# Patient Record
Sex: Male | Born: 1940 | Race: White | Hispanic: No | Marital: Married | Smoking: Never smoker
Health system: Southern US, Community
[De-identification: ages and names within clinical notes are randomized; demographics above are authoritative.]

## PROBLEM LIST (undated history)

## (undated) DIAGNOSIS — Z800 Family history of malignant neoplasm of digestive organs: Secondary | ICD-10-CM

## (undated) DIAGNOSIS — K2270 Barrett's esophagus without dysplasia: Secondary | ICD-10-CM

## (undated) DIAGNOSIS — M1990 Unspecified osteoarthritis, unspecified site: Secondary | ICD-10-CM

## (undated) DIAGNOSIS — Z801 Family history of malignant neoplasm of trachea, bronchus and lung: Secondary | ICD-10-CM

## (undated) DIAGNOSIS — C801 Malignant (primary) neoplasm, unspecified: Secondary | ICD-10-CM

## (undated) DIAGNOSIS — Z8719 Personal history of other diseases of the digestive system: Secondary | ICD-10-CM

## (undated) DIAGNOSIS — K5792 Diverticulitis of intestine, part unspecified, without perforation or abscess without bleeding: Secondary | ICD-10-CM

## (undated) DIAGNOSIS — Z808 Family history of malignant neoplasm of other organs or systems: Secondary | ICD-10-CM

## (undated) DIAGNOSIS — Z8042 Family history of malignant neoplasm of prostate: Secondary | ICD-10-CM

## (undated) DIAGNOSIS — R252 Cramp and spasm: Secondary | ICD-10-CM

## (undated) DIAGNOSIS — Z8041 Family history of malignant neoplasm of ovary: Secondary | ICD-10-CM

## (undated) DIAGNOSIS — R944 Abnormal results of kidney function studies: Secondary | ICD-10-CM

## (undated) DIAGNOSIS — Z8049 Family history of malignant neoplasm of other genital organs: Secondary | ICD-10-CM

## (undated) DIAGNOSIS — J189 Pneumonia, unspecified organism: Secondary | ICD-10-CM

## (undated) DIAGNOSIS — G7000 Myasthenia gravis without (acute) exacerbation: Secondary | ICD-10-CM

## (undated) DIAGNOSIS — D649 Anemia, unspecified: Secondary | ICD-10-CM

## (undated) HISTORY — DX: Family history of malignant neoplasm of prostate: Z80.42

## (undated) HISTORY — DX: Family history of malignant neoplasm of digestive organs: Z80.0

## (undated) HISTORY — PX: COLONOSCOPY: SHX174

## (undated) HISTORY — PX: CATARACT EXTRACTION, BILATERAL: SHX1313

## (undated) HISTORY — PX: VASECTOMY: SHX75

## (undated) HISTORY — PX: COLON SURGERY: SHX602

## (undated) HISTORY — DX: Family history of malignant neoplasm of other genital organs: Z80.49

## (undated) HISTORY — DX: Family history of malignant neoplasm of other organs or systems: Z80.8

## (undated) HISTORY — DX: Family history of malignant neoplasm of ovary: Z80.41

## (undated) HISTORY — DX: Family history of malignant neoplasm of trachea, bronchus and lung: Z80.1

---

## 2011-08-15 DIAGNOSIS — L570 Actinic keratosis: Secondary | ICD-10-CM | POA: Diagnosis not present

## 2011-08-15 DIAGNOSIS — L578 Other skin changes due to chronic exposure to nonionizing radiation: Secondary | ICD-10-CM | POA: Diagnosis not present

## 2011-08-15 DIAGNOSIS — B351 Tinea unguium: Secondary | ICD-10-CM | POA: Diagnosis not present

## 2011-08-15 DIAGNOSIS — D239 Other benign neoplasm of skin, unspecified: Secondary | ICD-10-CM | POA: Diagnosis not present

## 2011-09-13 DIAGNOSIS — G7000 Myasthenia gravis without (acute) exacerbation: Secondary | ICD-10-CM | POA: Diagnosis not present

## 2011-09-26 DIAGNOSIS — H532 Diplopia: Secondary | ICD-10-CM | POA: Diagnosis not present

## 2011-09-26 DIAGNOSIS — H2589 Other age-related cataract: Secondary | ICD-10-CM | POA: Diagnosis not present

## 2011-09-28 DIAGNOSIS — G7000 Myasthenia gravis without (acute) exacerbation: Secondary | ICD-10-CM | POA: Diagnosis not present

## 2011-09-28 DIAGNOSIS — R7309 Other abnormal glucose: Secondary | ICD-10-CM | POA: Diagnosis not present

## 2011-09-28 DIAGNOSIS — IMO0001 Reserved for inherently not codable concepts without codable children: Secondary | ICD-10-CM | POA: Diagnosis not present

## 2011-09-28 DIAGNOSIS — R609 Edema, unspecified: Secondary | ICD-10-CM | POA: Diagnosis not present

## 2011-10-10 DIAGNOSIS — G7000 Myasthenia gravis without (acute) exacerbation: Secondary | ICD-10-CM | POA: Diagnosis not present

## 2011-11-20 DIAGNOSIS — R609 Edema, unspecified: Secondary | ICD-10-CM | POA: Diagnosis not present

## 2011-11-20 DIAGNOSIS — R7309 Other abnormal glucose: Secondary | ICD-10-CM | POA: Diagnosis not present

## 2011-11-20 DIAGNOSIS — G7000 Myasthenia gravis without (acute) exacerbation: Secondary | ICD-10-CM | POA: Diagnosis not present

## 2011-12-11 DIAGNOSIS — G7000 Myasthenia gravis without (acute) exacerbation: Secondary | ICD-10-CM | POA: Diagnosis not present

## 2012-02-28 HISTORY — PX: COLOSTOMY: SHX63

## 2012-03-19 DIAGNOSIS — M47817 Spondylosis without myelopathy or radiculopathy, lumbosacral region: Secondary | ICD-10-CM | POA: Diagnosis not present

## 2012-03-21 DIAGNOSIS — R0989 Other specified symptoms and signs involving the circulatory and respiratory systems: Secondary | ICD-10-CM | POA: Diagnosis not present

## 2012-03-21 DIAGNOSIS — Z13820 Encounter for screening for osteoporosis: Secondary | ICD-10-CM | POA: Diagnosis not present

## 2012-03-21 DIAGNOSIS — N320 Bladder-neck obstruction: Secondary | ICD-10-CM | POA: Diagnosis not present

## 2012-03-21 DIAGNOSIS — M545 Low back pain: Secondary | ICD-10-CM | POA: Diagnosis not present

## 2012-03-21 DIAGNOSIS — E7800 Pure hypercholesterolemia, unspecified: Secondary | ICD-10-CM | POA: Diagnosis not present

## 2012-03-21 DIAGNOSIS — M899 Disorder of bone, unspecified: Secondary | ICD-10-CM | POA: Diagnosis not present

## 2012-03-21 DIAGNOSIS — R7309 Other abnormal glucose: Secondary | ICD-10-CM | POA: Diagnosis not present

## 2012-03-21 DIAGNOSIS — Z0000 Encounter for general adult medical examination without abnormal findings: Secondary | ICD-10-CM | POA: Diagnosis not present

## 2012-03-21 DIAGNOSIS — R0609 Other forms of dyspnea: Secondary | ICD-10-CM | POA: Diagnosis not present

## 2012-03-28 DIAGNOSIS — K6389 Other specified diseases of intestine: Secondary | ICD-10-CM | POA: Diagnosis not present

## 2012-03-28 DIAGNOSIS — R69 Illness, unspecified: Secondary | ICD-10-CM | POA: Diagnosis not present

## 2012-03-28 DIAGNOSIS — D126 Benign neoplasm of colon, unspecified: Secondary | ICD-10-CM | POA: Diagnosis not present

## 2012-03-28 DIAGNOSIS — R109 Unspecified abdominal pain: Secondary | ICD-10-CM | POA: Diagnosis not present

## 2012-03-28 DIAGNOSIS — K5732 Diverticulitis of large intestine without perforation or abscess without bleeding: Secondary | ICD-10-CM | POA: Diagnosis not present

## 2012-03-28 DIAGNOSIS — M4800 Spinal stenosis, site unspecified: Secondary | ICD-10-CM | POA: Diagnosis not present

## 2012-03-28 DIAGNOSIS — M545 Low back pain: Secondary | ICD-10-CM | POA: Diagnosis not present

## 2012-03-28 DIAGNOSIS — G7000 Myasthenia gravis without (acute) exacerbation: Secondary | ICD-10-CM | POA: Diagnosis not present

## 2012-03-28 DIAGNOSIS — Z79899 Other long term (current) drug therapy: Secondary | ICD-10-CM | POA: Diagnosis not present

## 2012-03-28 DIAGNOSIS — K2270 Barrett's esophagus without dysplasia: Secondary | ICD-10-CM | POA: Diagnosis not present

## 2012-03-28 DIAGNOSIS — K658 Other peritonitis: Secondary | ICD-10-CM | POA: Diagnosis not present

## 2012-03-28 DIAGNOSIS — IMO0002 Reserved for concepts with insufficient information to code with codable children: Secondary | ICD-10-CM | POA: Diagnosis not present

## 2012-03-28 DIAGNOSIS — K631 Perforation of intestine (nontraumatic): Secondary | ICD-10-CM | POA: Diagnosis not present

## 2012-03-28 DIAGNOSIS — K449 Diaphragmatic hernia without obstruction or gangrene: Secondary | ICD-10-CM | POA: Diagnosis not present

## 2012-03-28 DIAGNOSIS — K659 Peritonitis, unspecified: Secondary | ICD-10-CM | POA: Diagnosis not present

## 2012-03-28 DIAGNOSIS — K5730 Diverticulosis of large intestine without perforation or abscess without bleeding: Secondary | ICD-10-CM | POA: Diagnosis not present

## 2012-04-03 DIAGNOSIS — K650 Generalized (acute) peritonitis: Secondary | ICD-10-CM | POA: Diagnosis not present

## 2012-04-03 DIAGNOSIS — G8929 Other chronic pain: Secondary | ICD-10-CM | POA: Diagnosis not present

## 2012-04-03 DIAGNOSIS — G7000 Myasthenia gravis without (acute) exacerbation: Secondary | ICD-10-CM | POA: Diagnosis not present

## 2012-04-03 DIAGNOSIS — Z433 Encounter for attention to colostomy: Secondary | ICD-10-CM | POA: Diagnosis not present

## 2012-04-03 DIAGNOSIS — M4800 Spinal stenosis, site unspecified: Secondary | ICD-10-CM | POA: Diagnosis not present

## 2012-04-07 DIAGNOSIS — K650 Generalized (acute) peritonitis: Secondary | ICD-10-CM | POA: Diagnosis not present

## 2012-04-07 DIAGNOSIS — G8929 Other chronic pain: Secondary | ICD-10-CM | POA: Diagnosis not present

## 2012-04-07 DIAGNOSIS — M4800 Spinal stenosis, site unspecified: Secondary | ICD-10-CM | POA: Diagnosis not present

## 2012-04-07 DIAGNOSIS — Z433 Encounter for attention to colostomy: Secondary | ICD-10-CM | POA: Diagnosis not present

## 2012-04-07 DIAGNOSIS — G7000 Myasthenia gravis without (acute) exacerbation: Secondary | ICD-10-CM | POA: Diagnosis not present

## 2012-04-14 DIAGNOSIS — G8929 Other chronic pain: Secondary | ICD-10-CM | POA: Diagnosis not present

## 2012-04-14 DIAGNOSIS — M4800 Spinal stenosis, site unspecified: Secondary | ICD-10-CM | POA: Diagnosis not present

## 2012-04-14 DIAGNOSIS — Z433 Encounter for attention to colostomy: Secondary | ICD-10-CM | POA: Diagnosis not present

## 2012-04-14 DIAGNOSIS — G7000 Myasthenia gravis without (acute) exacerbation: Secondary | ICD-10-CM | POA: Diagnosis not present

## 2012-04-14 DIAGNOSIS — K650 Generalized (acute) peritonitis: Secondary | ICD-10-CM | POA: Diagnosis not present

## 2012-04-16 DIAGNOSIS — G7000 Myasthenia gravis without (acute) exacerbation: Secondary | ICD-10-CM | POA: Diagnosis not present

## 2012-04-18 DIAGNOSIS — M4800 Spinal stenosis, site unspecified: Secondary | ICD-10-CM | POA: Diagnosis not present

## 2012-04-18 DIAGNOSIS — Z433 Encounter for attention to colostomy: Secondary | ICD-10-CM | POA: Diagnosis not present

## 2012-04-18 DIAGNOSIS — G7000 Myasthenia gravis without (acute) exacerbation: Secondary | ICD-10-CM | POA: Diagnosis not present

## 2012-04-18 DIAGNOSIS — G8929 Other chronic pain: Secondary | ICD-10-CM | POA: Diagnosis not present

## 2012-04-18 DIAGNOSIS — K650 Generalized (acute) peritonitis: Secondary | ICD-10-CM | POA: Diagnosis not present

## 2012-04-21 DIAGNOSIS — M4800 Spinal stenosis, site unspecified: Secondary | ICD-10-CM | POA: Diagnosis not present

## 2012-04-21 DIAGNOSIS — G8929 Other chronic pain: Secondary | ICD-10-CM | POA: Diagnosis not present

## 2012-04-21 DIAGNOSIS — Z433 Encounter for attention to colostomy: Secondary | ICD-10-CM | POA: Diagnosis not present

## 2012-04-21 DIAGNOSIS — K650 Generalized (acute) peritonitis: Secondary | ICD-10-CM | POA: Diagnosis not present

## 2012-04-21 DIAGNOSIS — G7000 Myasthenia gravis without (acute) exacerbation: Secondary | ICD-10-CM | POA: Diagnosis not present

## 2012-05-15 DIAGNOSIS — M5126 Other intervertebral disc displacement, lumbar region: Secondary | ICD-10-CM | POA: Diagnosis not present

## 2012-05-15 DIAGNOSIS — M5137 Other intervertebral disc degeneration, lumbosacral region: Secondary | ICD-10-CM | POA: Diagnosis not present

## 2012-05-15 DIAGNOSIS — M5430 Sciatica, unspecified side: Secondary | ICD-10-CM | POA: Diagnosis not present

## 2012-05-15 DIAGNOSIS — M47817 Spondylosis without myelopathy or radiculopathy, lumbosacral region: Secondary | ICD-10-CM | POA: Diagnosis not present

## 2012-05-15 DIAGNOSIS — IMO0002 Reserved for concepts with insufficient information to code with codable children: Secondary | ICD-10-CM | POA: Diagnosis not present

## 2012-05-15 DIAGNOSIS — M48061 Spinal stenosis, lumbar region without neurogenic claudication: Secondary | ICD-10-CM | POA: Diagnosis not present

## 2012-05-27 DIAGNOSIS — IMO0002 Reserved for concepts with insufficient information to code with codable children: Secondary | ICD-10-CM | POA: Diagnosis not present

## 2012-05-30 HISTORY — PX: COLOSTOMY REVERSAL: SHX5782

## 2012-06-06 DIAGNOSIS — K6289 Other specified diseases of anus and rectum: Secondary | ICD-10-CM | POA: Diagnosis not present

## 2012-06-06 DIAGNOSIS — K5732 Diverticulitis of large intestine without perforation or abscess without bleeding: Secondary | ICD-10-CM | POA: Diagnosis not present

## 2012-06-06 DIAGNOSIS — Z433 Encounter for attention to colostomy: Secondary | ICD-10-CM | POA: Diagnosis not present

## 2012-06-06 DIAGNOSIS — G7000 Myasthenia gravis without (acute) exacerbation: Secondary | ICD-10-CM | POA: Diagnosis not present

## 2012-06-06 DIAGNOSIS — Z9889 Other specified postprocedural states: Secondary | ICD-10-CM | POA: Diagnosis not present

## 2012-06-06 DIAGNOSIS — Z880 Allergy status to penicillin: Secondary | ICD-10-CM | POA: Diagnosis not present

## 2012-06-06 DIAGNOSIS — M5430 Sciatica, unspecified side: Secondary | ICD-10-CM | POA: Diagnosis not present

## 2012-06-06 DIAGNOSIS — Z7982 Long term (current) use of aspirin: Secondary | ICD-10-CM | POA: Diagnosis not present

## 2012-06-11 DIAGNOSIS — R609 Edema, unspecified: Secondary | ICD-10-CM | POA: Diagnosis not present

## 2012-06-11 DIAGNOSIS — L0390 Cellulitis, unspecified: Secondary | ICD-10-CM | POA: Diagnosis not present

## 2012-06-11 DIAGNOSIS — L0291 Cutaneous abscess, unspecified: Secondary | ICD-10-CM | POA: Diagnosis not present

## 2012-06-19 DIAGNOSIS — R197 Diarrhea, unspecified: Secondary | ICD-10-CM | POA: Diagnosis not present

## 2012-07-08 DIAGNOSIS — K5732 Diverticulitis of large intestine without perforation or abscess without bleeding: Secondary | ICD-10-CM | POA: Diagnosis not present

## 2012-07-08 DIAGNOSIS — K2270 Barrett's esophagus without dysplasia: Secondary | ICD-10-CM | POA: Diagnosis not present

## 2012-07-08 DIAGNOSIS — R197 Diarrhea, unspecified: Secondary | ICD-10-CM | POA: Diagnosis not present

## 2012-07-17 DIAGNOSIS — G7000 Myasthenia gravis without (acute) exacerbation: Secondary | ICD-10-CM | POA: Diagnosis not present

## 2012-08-04 DIAGNOSIS — IMO0002 Reserved for concepts with insufficient information to code with codable children: Secondary | ICD-10-CM | POA: Diagnosis not present

## 2012-08-08 DIAGNOSIS — IMO0002 Reserved for concepts with insufficient information to code with codable children: Secondary | ICD-10-CM | POA: Diagnosis not present

## 2012-08-11 DIAGNOSIS — IMO0002 Reserved for concepts with insufficient information to code with codable children: Secondary | ICD-10-CM | POA: Diagnosis not present

## 2012-08-13 DIAGNOSIS — IMO0002 Reserved for concepts with insufficient information to code with codable children: Secondary | ICD-10-CM | POA: Diagnosis not present

## 2012-08-15 DIAGNOSIS — Z23 Encounter for immunization: Secondary | ICD-10-CM | POA: Diagnosis not present

## 2012-08-18 DIAGNOSIS — IMO0002 Reserved for concepts with insufficient information to code with codable children: Secondary | ICD-10-CM | POA: Diagnosis not present

## 2012-08-20 DIAGNOSIS — L578 Other skin changes due to chronic exposure to nonionizing radiation: Secondary | ICD-10-CM | POA: Diagnosis not present

## 2012-08-20 DIAGNOSIS — D485 Neoplasm of uncertain behavior of skin: Secondary | ICD-10-CM | POA: Diagnosis not present

## 2012-08-20 DIAGNOSIS — IMO0002 Reserved for concepts with insufficient information to code with codable children: Secondary | ICD-10-CM | POA: Diagnosis not present

## 2012-08-20 DIAGNOSIS — B351 Tinea unguium: Secondary | ICD-10-CM | POA: Diagnosis not present

## 2012-08-20 DIAGNOSIS — D239 Other benign neoplasm of skin, unspecified: Secondary | ICD-10-CM | POA: Diagnosis not present

## 2012-09-15 DIAGNOSIS — D239 Other benign neoplasm of skin, unspecified: Secondary | ICD-10-CM | POA: Diagnosis not present

## 2012-09-15 DIAGNOSIS — D485 Neoplasm of uncertain behavior of skin: Secondary | ICD-10-CM | POA: Diagnosis not present

## 2012-10-01 DIAGNOSIS — R1013 Epigastric pain: Secondary | ICD-10-CM | POA: Diagnosis not present

## 2012-10-01 DIAGNOSIS — E7800 Pure hypercholesterolemia, unspecified: Secondary | ICD-10-CM | POA: Diagnosis not present

## 2012-10-16 DIAGNOSIS — G7000 Myasthenia gravis without (acute) exacerbation: Secondary | ICD-10-CM | POA: Diagnosis not present

## 2012-10-17 DIAGNOSIS — IMO0002 Reserved for concepts with insufficient information to code with codable children: Secondary | ICD-10-CM | POA: Diagnosis not present

## 2012-10-22 DIAGNOSIS — IMO0002 Reserved for concepts with insufficient information to code with codable children: Secondary | ICD-10-CM | POA: Diagnosis not present

## 2012-10-29 DIAGNOSIS — IMO0002 Reserved for concepts with insufficient information to code with codable children: Secondary | ICD-10-CM | POA: Diagnosis not present

## 2013-01-12 DIAGNOSIS — L821 Other seborrheic keratosis: Secondary | ICD-10-CM | POA: Diagnosis not present

## 2013-01-12 DIAGNOSIS — L578 Other skin changes due to chronic exposure to nonionizing radiation: Secondary | ICD-10-CM | POA: Diagnosis not present

## 2013-01-12 DIAGNOSIS — B079 Viral wart, unspecified: Secondary | ICD-10-CM | POA: Diagnosis not present

## 2013-01-12 DIAGNOSIS — D239 Other benign neoplasm of skin, unspecified: Secondary | ICD-10-CM | POA: Diagnosis not present

## 2013-01-21 DIAGNOSIS — G7000 Myasthenia gravis without (acute) exacerbation: Secondary | ICD-10-CM | POA: Diagnosis not present

## 2013-04-14 DIAGNOSIS — R5383 Other fatigue: Secondary | ICD-10-CM | POA: Diagnosis not present

## 2013-04-14 DIAGNOSIS — E119 Type 2 diabetes mellitus without complications: Secondary | ICD-10-CM | POA: Diagnosis not present

## 2013-04-14 DIAGNOSIS — R5381 Other malaise: Secondary | ICD-10-CM | POA: Diagnosis not present

## 2013-04-14 DIAGNOSIS — E7800 Pure hypercholesterolemia, unspecified: Secondary | ICD-10-CM | POA: Diagnosis not present

## 2013-04-14 DIAGNOSIS — Z0000 Encounter for general adult medical examination without abnormal findings: Secondary | ICD-10-CM | POA: Diagnosis not present

## 2013-04-14 DIAGNOSIS — Z23 Encounter for immunization: Secondary | ICD-10-CM | POA: Diagnosis not present

## 2013-04-14 DIAGNOSIS — N320 Bladder-neck obstruction: Secondary | ICD-10-CM | POA: Diagnosis not present

## 2013-05-27 DIAGNOSIS — G7000 Myasthenia gravis without (acute) exacerbation: Secondary | ICD-10-CM | POA: Diagnosis not present

## 2013-05-28 DIAGNOSIS — R799 Abnormal finding of blood chemistry, unspecified: Secondary | ICD-10-CM | POA: Diagnosis not present

## 2013-05-28 DIAGNOSIS — R972 Elevated prostate specific antigen [PSA]: Secondary | ICD-10-CM | POA: Diagnosis not present

## 2013-06-09 DIAGNOSIS — R21 Rash and other nonspecific skin eruption: Secondary | ICD-10-CM | POA: Diagnosis not present

## 2013-06-09 DIAGNOSIS — N189 Chronic kidney disease, unspecified: Secondary | ICD-10-CM | POA: Diagnosis not present

## 2013-06-09 DIAGNOSIS — R972 Elevated prostate specific antigen [PSA]: Secondary | ICD-10-CM | POA: Diagnosis not present

## 2013-06-16 DIAGNOSIS — R799 Abnormal finding of blood chemistry, unspecified: Secondary | ICD-10-CM | POA: Diagnosis not present

## 2013-06-16 DIAGNOSIS — H2589 Other age-related cataract: Secondary | ICD-10-CM | POA: Diagnosis not present

## 2013-06-16 DIAGNOSIS — N281 Cyst of kidney, acquired: Secondary | ICD-10-CM | POA: Diagnosis not present

## 2013-06-16 DIAGNOSIS — R944 Abnormal results of kidney function studies: Secondary | ICD-10-CM | POA: Diagnosis not present

## 2013-06-26 DIAGNOSIS — D801 Nonfamilial hypogammaglobulinemia: Secondary | ICD-10-CM | POA: Diagnosis not present

## 2013-06-26 DIAGNOSIS — D649 Anemia, unspecified: Secondary | ICD-10-CM | POA: Diagnosis not present

## 2013-06-26 DIAGNOSIS — R799 Abnormal finding of blood chemistry, unspecified: Secondary | ICD-10-CM | POA: Diagnosis not present

## 2013-06-26 DIAGNOSIS — D472 Monoclonal gammopathy: Secondary | ICD-10-CM | POA: Diagnosis not present

## 2013-06-29 DIAGNOSIS — Z85828 Personal history of other malignant neoplasm of skin: Secondary | ICD-10-CM | POA: Diagnosis not present

## 2013-06-29 DIAGNOSIS — L738 Other specified follicular disorders: Secondary | ICD-10-CM | POA: Diagnosis not present

## 2013-06-29 DIAGNOSIS — L565 Disseminated superficial actinic porokeratosis (DSAP): Secondary | ICD-10-CM | POA: Diagnosis not present

## 2013-06-29 DIAGNOSIS — L820 Inflamed seborrheic keratosis: Secondary | ICD-10-CM | POA: Diagnosis not present

## 2013-06-29 DIAGNOSIS — L821 Other seborrheic keratosis: Secondary | ICD-10-CM | POA: Diagnosis not present

## 2013-06-29 DIAGNOSIS — D485 Neoplasm of uncertain behavior of skin: Secondary | ICD-10-CM | POA: Diagnosis not present

## 2013-06-29 DIAGNOSIS — D235 Other benign neoplasm of skin of trunk: Secondary | ICD-10-CM | POA: Diagnosis not present

## 2013-07-15 DIAGNOSIS — L821 Other seborrheic keratosis: Secondary | ICD-10-CM | POA: Diagnosis not present

## 2013-07-15 DIAGNOSIS — L570 Actinic keratosis: Secondary | ICD-10-CM | POA: Diagnosis not present

## 2013-07-28 DIAGNOSIS — D649 Anemia, unspecified: Secondary | ICD-10-CM | POA: Diagnosis not present

## 2013-07-28 DIAGNOSIS — D472 Monoclonal gammopathy: Secondary | ICD-10-CM | POA: Diagnosis not present

## 2013-09-30 DIAGNOSIS — G7000 Myasthenia gravis without (acute) exacerbation: Secondary | ICD-10-CM | POA: Diagnosis not present

## 2013-10-16 DIAGNOSIS — G7000 Myasthenia gravis without (acute) exacerbation: Secondary | ICD-10-CM | POA: Diagnosis not present

## 2013-10-16 DIAGNOSIS — E7800 Pure hypercholesterolemia, unspecified: Secondary | ICD-10-CM | POA: Diagnosis not present

## 2013-10-16 DIAGNOSIS — N189 Chronic kidney disease, unspecified: Secondary | ICD-10-CM | POA: Diagnosis not present

## 2013-10-16 DIAGNOSIS — D649 Anemia, unspecified: Secondary | ICD-10-CM | POA: Diagnosis not present

## 2013-10-16 DIAGNOSIS — R972 Elevated prostate specific antigen [PSA]: Secondary | ICD-10-CM | POA: Diagnosis not present

## 2013-10-16 DIAGNOSIS — N400 Enlarged prostate without lower urinary tract symptoms: Secondary | ICD-10-CM | POA: Diagnosis not present

## 2013-11-17 DIAGNOSIS — Z85828 Personal history of other malignant neoplasm of skin: Secondary | ICD-10-CM | POA: Diagnosis not present

## 2013-11-17 DIAGNOSIS — L570 Actinic keratosis: Secondary | ICD-10-CM | POA: Diagnosis not present

## 2013-11-17 DIAGNOSIS — L821 Other seborrheic keratosis: Secondary | ICD-10-CM | POA: Diagnosis not present

## 2013-11-17 DIAGNOSIS — B079 Viral wart, unspecified: Secondary | ICD-10-CM | POA: Diagnosis not present

## 2013-12-30 DIAGNOSIS — M25819 Other specified joint disorders, unspecified shoulder: Secondary | ICD-10-CM | POA: Diagnosis not present

## 2014-01-06 DIAGNOSIS — R609 Edema, unspecified: Secondary | ICD-10-CM | POA: Diagnosis not present

## 2014-01-06 DIAGNOSIS — Z8719 Personal history of other diseases of the digestive system: Secondary | ICD-10-CM | POA: Diagnosis not present

## 2014-01-06 DIAGNOSIS — IMO0002 Reserved for concepts with insufficient information to code with codable children: Secondary | ICD-10-CM | POA: Diagnosis not present

## 2014-01-06 DIAGNOSIS — K219 Gastro-esophageal reflux disease without esophagitis: Secondary | ICD-10-CM | POA: Diagnosis not present

## 2014-01-06 DIAGNOSIS — G7000 Myasthenia gravis without (acute) exacerbation: Secondary | ICD-10-CM | POA: Diagnosis not present

## 2014-01-19 DIAGNOSIS — D472 Monoclonal gammopathy: Secondary | ICD-10-CM | POA: Diagnosis not present

## 2014-01-19 DIAGNOSIS — D509 Iron deficiency anemia, unspecified: Secondary | ICD-10-CM | POA: Diagnosis not present

## 2014-01-27 DIAGNOSIS — M25819 Other specified joint disorders, unspecified shoulder: Secondary | ICD-10-CM | POA: Diagnosis not present

## 2014-02-04 DIAGNOSIS — D509 Iron deficiency anemia, unspecified: Secondary | ICD-10-CM | POA: Diagnosis not present

## 2014-02-08 DIAGNOSIS — M25819 Other specified joint disorders, unspecified shoulder: Secondary | ICD-10-CM | POA: Diagnosis not present

## 2014-02-10 DIAGNOSIS — Z85828 Personal history of other malignant neoplasm of skin: Secondary | ICD-10-CM | POA: Diagnosis not present

## 2014-02-10 DIAGNOSIS — L84 Corns and callosities: Secondary | ICD-10-CM | POA: Diagnosis not present

## 2014-02-10 DIAGNOSIS — L821 Other seborrheic keratosis: Secondary | ICD-10-CM | POA: Diagnosis not present

## 2014-02-10 DIAGNOSIS — L819 Disorder of pigmentation, unspecified: Secondary | ICD-10-CM | POA: Diagnosis not present

## 2014-02-10 DIAGNOSIS — B353 Tinea pedis: Secondary | ICD-10-CM | POA: Diagnosis not present

## 2014-02-10 DIAGNOSIS — D239 Other benign neoplasm of skin, unspecified: Secondary | ICD-10-CM | POA: Diagnosis not present

## 2014-02-15 DIAGNOSIS — M25819 Other specified joint disorders, unspecified shoulder: Secondary | ICD-10-CM | POA: Diagnosis not present

## 2014-02-18 DIAGNOSIS — G7000 Myasthenia gravis without (acute) exacerbation: Secondary | ICD-10-CM | POA: Diagnosis not present

## 2014-02-24 DIAGNOSIS — M25819 Other specified joint disorders, unspecified shoulder: Secondary | ICD-10-CM | POA: Diagnosis not present

## 2014-03-02 DIAGNOSIS — M899 Disorder of bone, unspecified: Secondary | ICD-10-CM | POA: Diagnosis not present

## 2014-03-03 DIAGNOSIS — M25819 Other specified joint disorders, unspecified shoulder: Secondary | ICD-10-CM | POA: Diagnosis not present

## 2014-03-09 DIAGNOSIS — M5090 Cervical disc disorder, unspecified, unspecified cervical region: Secondary | ICD-10-CM | POA: Diagnosis not present

## 2014-03-09 DIAGNOSIS — M542 Cervicalgia: Secondary | ICD-10-CM | POA: Diagnosis not present

## 2014-03-19 DIAGNOSIS — M542 Cervicalgia: Secondary | ICD-10-CM | POA: Diagnosis not present

## 2014-03-22 DIAGNOSIS — M25819 Other specified joint disorders, unspecified shoulder: Secondary | ICD-10-CM | POA: Diagnosis not present

## 2014-03-29 DIAGNOSIS — H04129 Dry eye syndrome of unspecified lacrimal gland: Secondary | ICD-10-CM | POA: Diagnosis not present

## 2014-03-29 DIAGNOSIS — H25019 Cortical age-related cataract, unspecified eye: Secondary | ICD-10-CM | POA: Diagnosis not present

## 2014-03-31 DIAGNOSIS — Z0000 Encounter for general adult medical examination without abnormal findings: Secondary | ICD-10-CM | POA: Diagnosis not present

## 2014-03-31 DIAGNOSIS — Z1331 Encounter for screening for depression: Secondary | ICD-10-CM | POA: Diagnosis not present

## 2014-03-31 DIAGNOSIS — N183 Chronic kidney disease, stage 3 unspecified: Secondary | ICD-10-CM | POA: Diagnosis not present

## 2014-03-31 DIAGNOSIS — M542 Cervicalgia: Secondary | ICD-10-CM | POA: Diagnosis not present

## 2014-03-31 DIAGNOSIS — D472 Monoclonal gammopathy: Secondary | ICD-10-CM | POA: Diagnosis not present

## 2014-03-31 DIAGNOSIS — R5381 Other malaise: Secondary | ICD-10-CM | POA: Diagnosis not present

## 2014-03-31 DIAGNOSIS — K219 Gastro-esophageal reflux disease without esophagitis: Secondary | ICD-10-CM | POA: Diagnosis not present

## 2014-03-31 DIAGNOSIS — Z8719 Personal history of other diseases of the digestive system: Secondary | ICD-10-CM | POA: Diagnosis not present

## 2014-03-31 DIAGNOSIS — D509 Iron deficiency anemia, unspecified: Secondary | ICD-10-CM | POA: Diagnosis not present

## 2014-03-31 DIAGNOSIS — Z1211 Encounter for screening for malignant neoplasm of colon: Secondary | ICD-10-CM | POA: Diagnosis not present

## 2014-03-31 DIAGNOSIS — G7000 Myasthenia gravis without (acute) exacerbation: Secondary | ICD-10-CM | POA: Diagnosis not present

## 2014-03-31 DIAGNOSIS — Z136 Encounter for screening for cardiovascular disorders: Secondary | ICD-10-CM | POA: Diagnosis not present

## 2014-03-31 DIAGNOSIS — N400 Enlarged prostate without lower urinary tract symptoms: Secondary | ICD-10-CM | POA: Diagnosis not present

## 2014-04-06 DIAGNOSIS — M5090 Cervical disc disorder, unspecified, unspecified cervical region: Secondary | ICD-10-CM | POA: Diagnosis not present

## 2014-04-28 DIAGNOSIS — D509 Iron deficiency anemia, unspecified: Secondary | ICD-10-CM | POA: Diagnosis not present

## 2014-04-28 DIAGNOSIS — K2270 Barrett's esophagus without dysplasia: Secondary | ICD-10-CM | POA: Diagnosis not present

## 2014-06-01 DIAGNOSIS — G7000 Myasthenia gravis without (acute) exacerbation: Secondary | ICD-10-CM | POA: Diagnosis not present

## 2014-06-08 DIAGNOSIS — K2270 Barrett's esophagus without dysplasia: Secondary | ICD-10-CM | POA: Diagnosis not present

## 2014-06-08 DIAGNOSIS — K208 Other esophagitis: Secondary | ICD-10-CM | POA: Diagnosis not present

## 2014-06-08 DIAGNOSIS — K449 Diaphragmatic hernia without obstruction or gangrene: Secondary | ICD-10-CM | POA: Diagnosis not present

## 2014-06-08 DIAGNOSIS — K529 Noninfective gastroenteritis and colitis, unspecified: Secondary | ICD-10-CM | POA: Diagnosis not present

## 2014-06-08 DIAGNOSIS — D509 Iron deficiency anemia, unspecified: Secondary | ICD-10-CM | POA: Diagnosis not present

## 2014-06-08 DIAGNOSIS — K6389 Other specified diseases of intestine: Secondary | ICD-10-CM | POA: Diagnosis not present

## 2014-06-08 DIAGNOSIS — K633 Ulcer of intestine: Secondary | ICD-10-CM | POA: Diagnosis not present

## 2014-06-08 DIAGNOSIS — K5730 Diverticulosis of large intestine without perforation or abscess without bleeding: Secondary | ICD-10-CM | POA: Diagnosis not present

## 2014-06-08 DIAGNOSIS — Z86010 Personal history of colonic polyps: Secondary | ICD-10-CM | POA: Diagnosis not present

## 2014-06-14 DIAGNOSIS — Z23 Encounter for immunization: Secondary | ICD-10-CM | POA: Diagnosis not present

## 2014-06-17 DIAGNOSIS — Z23 Encounter for immunization: Secondary | ICD-10-CM | POA: Diagnosis not present

## 2014-07-14 ENCOUNTER — Other Ambulatory Visit: Payer: Self-pay | Admitting: Internal Medicine

## 2014-07-14 ENCOUNTER — Ambulatory Visit
Admission: RE | Admit: 2014-07-14 | Discharge: 2014-07-14 | Disposition: A | Discharge status: Home / Self Care | Payer: Medicare Other | Source: Ambulatory Visit | Attending: Internal Medicine | Admitting: Internal Medicine

## 2014-07-14 DIAGNOSIS — R5383 Other fatigue: Secondary | ICD-10-CM | POA: Diagnosis not present

## 2014-07-14 DIAGNOSIS — D239 Other benign neoplasm of skin, unspecified: Secondary | ICD-10-CM | POA: Diagnosis not present

## 2014-07-14 DIAGNOSIS — D509 Iron deficiency anemia, unspecified: Secondary | ICD-10-CM | POA: Diagnosis not present

## 2014-07-14 DIAGNOSIS — R059 Cough, unspecified: Secondary | ICD-10-CM

## 2014-07-14 DIAGNOSIS — R05 Cough: Secondary | ICD-10-CM | POA: Diagnosis not present

## 2014-07-14 DIAGNOSIS — L814 Other melanin hyperpigmentation: Secondary | ICD-10-CM | POA: Diagnosis not present

## 2014-07-14 DIAGNOSIS — B353 Tinea pedis: Secondary | ICD-10-CM | POA: Diagnosis not present

## 2014-07-14 DIAGNOSIS — Z85828 Personal history of other malignant neoplasm of skin: Secondary | ICD-10-CM | POA: Diagnosis not present

## 2014-07-14 DIAGNOSIS — J984 Other disorders of lung: Secondary | ICD-10-CM | POA: Diagnosis not present

## 2014-07-14 DIAGNOSIS — L821 Other seborrheic keratosis: Secondary | ICD-10-CM | POA: Diagnosis not present

## 2014-07-15 DIAGNOSIS — M7062 Trochanteric bursitis, left hip: Secondary | ICD-10-CM | POA: Diagnosis not present

## 2014-07-15 DIAGNOSIS — M7061 Trochanteric bursitis, right hip: Secondary | ICD-10-CM | POA: Diagnosis not present

## 2014-07-20 DIAGNOSIS — M25552 Pain in left hip: Secondary | ICD-10-CM | POA: Diagnosis not present

## 2014-07-20 DIAGNOSIS — M25551 Pain in right hip: Secondary | ICD-10-CM | POA: Diagnosis not present

## 2014-07-27 DIAGNOSIS — M25551 Pain in right hip: Secondary | ICD-10-CM | POA: Diagnosis not present

## 2014-07-27 DIAGNOSIS — M25552 Pain in left hip: Secondary | ICD-10-CM | POA: Diagnosis not present

## 2014-08-02 DIAGNOSIS — M25552 Pain in left hip: Secondary | ICD-10-CM | POA: Diagnosis not present

## 2014-08-02 DIAGNOSIS — M25551 Pain in right hip: Secondary | ICD-10-CM | POA: Diagnosis not present

## 2014-08-04 DIAGNOSIS — G7000 Myasthenia gravis without (acute) exacerbation: Secondary | ICD-10-CM | POA: Diagnosis not present

## 2014-08-05 DIAGNOSIS — M25552 Pain in left hip: Secondary | ICD-10-CM | POA: Diagnosis not present

## 2014-08-05 DIAGNOSIS — M25551 Pain in right hip: Secondary | ICD-10-CM | POA: Diagnosis not present

## 2014-08-09 DIAGNOSIS — M25551 Pain in right hip: Secondary | ICD-10-CM | POA: Diagnosis not present

## 2014-08-09 DIAGNOSIS — M25552 Pain in left hip: Secondary | ICD-10-CM | POA: Diagnosis not present

## 2014-08-12 DIAGNOSIS — M25552 Pain in left hip: Secondary | ICD-10-CM | POA: Diagnosis not present

## 2014-08-12 DIAGNOSIS — M25551 Pain in right hip: Secondary | ICD-10-CM | POA: Diagnosis not present

## 2014-08-16 DIAGNOSIS — M25552 Pain in left hip: Secondary | ICD-10-CM | POA: Diagnosis not present

## 2014-08-16 DIAGNOSIS — M25551 Pain in right hip: Secondary | ICD-10-CM | POA: Diagnosis not present

## 2014-08-18 DIAGNOSIS — M25551 Pain in right hip: Secondary | ICD-10-CM | POA: Diagnosis not present

## 2014-08-18 DIAGNOSIS — M25552 Pain in left hip: Secondary | ICD-10-CM | POA: Diagnosis not present

## 2014-08-19 DIAGNOSIS — M25551 Pain in right hip: Secondary | ICD-10-CM | POA: Diagnosis not present

## 2014-08-23 ENCOUNTER — Other Ambulatory Visit: Payer: Self-pay | Admitting: Internal Medicine

## 2014-08-23 ENCOUNTER — Ambulatory Visit
Admission: RE | Admit: 2014-08-23 | Discharge: 2014-08-23 | Disposition: A | Discharge status: Home / Self Care | Payer: Medicare Other | Source: Ambulatory Visit | Attending: Internal Medicine | Admitting: Internal Medicine

## 2014-08-23 DIAGNOSIS — J9811 Atelectasis: Secondary | ICD-10-CM | POA: Diagnosis not present

## 2014-08-23 DIAGNOSIS — R05 Cough: Secondary | ICD-10-CM | POA: Diagnosis not present

## 2014-08-24 DIAGNOSIS — M25552 Pain in left hip: Secondary | ICD-10-CM | POA: Diagnosis not present

## 2014-08-24 DIAGNOSIS — M25551 Pain in right hip: Secondary | ICD-10-CM | POA: Diagnosis not present

## 2014-08-26 DIAGNOSIS — R05 Cough: Secondary | ICD-10-CM | POA: Diagnosis not present

## 2014-08-26 DIAGNOSIS — M25552 Pain in left hip: Secondary | ICD-10-CM | POA: Diagnosis not present

## 2014-08-26 DIAGNOSIS — M25551 Pain in right hip: Secondary | ICD-10-CM | POA: Diagnosis not present

## 2014-09-06 DIAGNOSIS — M67911 Unspecified disorder of synovium and tendon, right shoulder: Secondary | ICD-10-CM | POA: Diagnosis not present

## 2014-09-09 DIAGNOSIS — M25551 Pain in right hip: Secondary | ICD-10-CM | POA: Diagnosis not present

## 2014-09-09 DIAGNOSIS — D509 Iron deficiency anemia, unspecified: Secondary | ICD-10-CM | POA: Diagnosis not present

## 2014-09-09 DIAGNOSIS — M25552 Pain in left hip: Secondary | ICD-10-CM | POA: Diagnosis not present

## 2014-09-09 DIAGNOSIS — K2270 Barrett's esophagus without dysplasia: Secondary | ICD-10-CM | POA: Diagnosis not present

## 2014-09-09 DIAGNOSIS — K219 Gastro-esophageal reflux disease without esophagitis: Secondary | ICD-10-CM | POA: Diagnosis not present

## 2014-09-16 DIAGNOSIS — M25511 Pain in right shoulder: Secondary | ICD-10-CM | POA: Diagnosis not present

## 2014-09-16 DIAGNOSIS — M25552 Pain in left hip: Secondary | ICD-10-CM | POA: Diagnosis not present

## 2014-09-21 DIAGNOSIS — M25552 Pain in left hip: Secondary | ICD-10-CM | POA: Diagnosis not present

## 2014-09-21 DIAGNOSIS — M25551 Pain in right hip: Secondary | ICD-10-CM | POA: Diagnosis not present

## 2014-09-23 DIAGNOSIS — M25511 Pain in right shoulder: Secondary | ICD-10-CM | POA: Diagnosis not present

## 2014-09-23 DIAGNOSIS — M7581 Other shoulder lesions, right shoulder: Secondary | ICD-10-CM | POA: Diagnosis not present

## 2014-09-29 DIAGNOSIS — Z79899 Other long term (current) drug therapy: Secondary | ICD-10-CM | POA: Diagnosis not present

## 2014-09-29 DIAGNOSIS — G7000 Myasthenia gravis without (acute) exacerbation: Secondary | ICD-10-CM | POA: Diagnosis not present

## 2014-09-29 DIAGNOSIS — D472 Monoclonal gammopathy: Secondary | ICD-10-CM | POA: Diagnosis not present

## 2014-09-29 DIAGNOSIS — R05 Cough: Secondary | ICD-10-CM | POA: Diagnosis not present

## 2014-09-29 DIAGNOSIS — M7581 Other shoulder lesions, right shoulder: Secondary | ICD-10-CM | POA: Diagnosis not present

## 2014-09-29 DIAGNOSIS — M25511 Pain in right shoulder: Secondary | ICD-10-CM | POA: Diagnosis not present

## 2014-10-07 DIAGNOSIS — M7581 Other shoulder lesions, right shoulder: Secondary | ICD-10-CM | POA: Diagnosis not present

## 2014-10-07 DIAGNOSIS — M25511 Pain in right shoulder: Secondary | ICD-10-CM | POA: Diagnosis not present

## 2014-10-13 DIAGNOSIS — M25511 Pain in right shoulder: Secondary | ICD-10-CM | POA: Diagnosis not present

## 2014-10-25 DIAGNOSIS — M79662 Pain in left lower leg: Secondary | ICD-10-CM | POA: Diagnosis not present

## 2014-10-28 DIAGNOSIS — G7000 Myasthenia gravis without (acute) exacerbation: Secondary | ICD-10-CM | POA: Diagnosis not present

## 2014-11-01 DIAGNOSIS — H25813 Combined forms of age-related cataract, bilateral: Secondary | ICD-10-CM | POA: Diagnosis not present

## 2014-11-01 DIAGNOSIS — H524 Presbyopia: Secondary | ICD-10-CM | POA: Diagnosis not present

## 2014-11-01 DIAGNOSIS — H52222 Regular astigmatism, left eye: Secondary | ICD-10-CM | POA: Diagnosis not present

## 2014-11-01 DIAGNOSIS — H5211 Myopia, right eye: Secondary | ICD-10-CM | POA: Diagnosis not present

## 2014-11-04 DIAGNOSIS — M79662 Pain in left lower leg: Secondary | ICD-10-CM | POA: Diagnosis not present

## 2014-11-04 DIAGNOSIS — M7989 Other specified soft tissue disorders: Secondary | ICD-10-CM | POA: Diagnosis not present

## 2014-11-08 DIAGNOSIS — M7541 Impingement syndrome of right shoulder: Secondary | ICD-10-CM | POA: Diagnosis not present

## 2014-11-16 DIAGNOSIS — M7061 Trochanteric bursitis, right hip: Secondary | ICD-10-CM | POA: Diagnosis not present

## 2014-11-22 DIAGNOSIS — M79662 Pain in left lower leg: Secondary | ICD-10-CM | POA: Diagnosis not present

## 2014-12-14 DIAGNOSIS — M7061 Trochanteric bursitis, right hip: Secondary | ICD-10-CM | POA: Diagnosis not present

## 2014-12-14 DIAGNOSIS — M7062 Trochanteric bursitis, left hip: Secondary | ICD-10-CM | POA: Diagnosis not present

## 2015-01-05 DIAGNOSIS — D225 Melanocytic nevi of trunk: Secondary | ICD-10-CM | POA: Diagnosis not present

## 2015-01-05 DIAGNOSIS — L821 Other seborrheic keratosis: Secondary | ICD-10-CM | POA: Diagnosis not present

## 2015-01-05 DIAGNOSIS — Z85828 Personal history of other malignant neoplasm of skin: Secondary | ICD-10-CM | POA: Diagnosis not present

## 2015-01-05 DIAGNOSIS — B353 Tinea pedis: Secondary | ICD-10-CM | POA: Diagnosis not present

## 2015-01-20 DIAGNOSIS — G7000 Myasthenia gravis without (acute) exacerbation: Secondary | ICD-10-CM | POA: Diagnosis not present

## 2015-01-27 DIAGNOSIS — E611 Iron deficiency: Secondary | ICD-10-CM | POA: Diagnosis not present

## 2015-01-27 DIAGNOSIS — D472 Monoclonal gammopathy: Secondary | ICD-10-CM | POA: Diagnosis not present

## 2015-02-10 DIAGNOSIS — D509 Iron deficiency anemia, unspecified: Secondary | ICD-10-CM | POA: Diagnosis not present

## 2015-03-02 DIAGNOSIS — M7541 Impingement syndrome of right shoulder: Secondary | ICD-10-CM | POA: Diagnosis not present

## 2015-03-08 DIAGNOSIS — M7541 Impingement syndrome of right shoulder: Secondary | ICD-10-CM | POA: Diagnosis not present

## 2015-03-16 DIAGNOSIS — M7541 Impingement syndrome of right shoulder: Secondary | ICD-10-CM | POA: Diagnosis not present

## 2015-03-17 DIAGNOSIS — Z86010 Personal history of colonic polyps: Secondary | ICD-10-CM | POA: Diagnosis not present

## 2015-03-17 DIAGNOSIS — K2270 Barrett's esophagus without dysplasia: Secondary | ICD-10-CM | POA: Diagnosis not present

## 2015-03-28 DIAGNOSIS — M7541 Impingement syndrome of right shoulder: Secondary | ICD-10-CM | POA: Diagnosis not present

## 2015-03-31 DIAGNOSIS — M7061 Trochanteric bursitis, right hip: Secondary | ICD-10-CM | POA: Diagnosis not present

## 2015-04-08 DIAGNOSIS — D509 Iron deficiency anemia, unspecified: Secondary | ICD-10-CM | POA: Diagnosis not present

## 2015-04-08 DIAGNOSIS — G7000 Myasthenia gravis without (acute) exacerbation: Secondary | ICD-10-CM | POA: Diagnosis not present

## 2015-04-08 DIAGNOSIS — Z0000 Encounter for general adult medical examination without abnormal findings: Secondary | ICD-10-CM | POA: Diagnosis not present

## 2015-04-08 DIAGNOSIS — K2270 Barrett's esophagus without dysplasia: Secondary | ICD-10-CM | POA: Diagnosis not present

## 2015-04-08 DIAGNOSIS — Z79899 Other long term (current) drug therapy: Secondary | ICD-10-CM | POA: Diagnosis not present

## 2015-04-08 DIAGNOSIS — N183 Chronic kidney disease, stage 3 (moderate): Secondary | ICD-10-CM | POA: Diagnosis not present

## 2015-04-08 DIAGNOSIS — Z1389 Encounter for screening for other disorder: Secondary | ICD-10-CM | POA: Diagnosis not present

## 2015-04-08 DIAGNOSIS — D472 Monoclonal gammopathy: Secondary | ICD-10-CM | POA: Diagnosis not present

## 2015-04-08 DIAGNOSIS — Z125 Encounter for screening for malignant neoplasm of prostate: Secondary | ICD-10-CM | POA: Diagnosis not present

## 2015-05-19 DIAGNOSIS — M5441 Lumbago with sciatica, right side: Secondary | ICD-10-CM | POA: Diagnosis not present

## 2015-05-26 DIAGNOSIS — M545 Low back pain: Secondary | ICD-10-CM | POA: Diagnosis not present

## 2015-06-01 DIAGNOSIS — M4806 Spinal stenosis, lumbar region: Secondary | ICD-10-CM | POA: Diagnosis not present

## 2015-06-02 DIAGNOSIS — G7000 Myasthenia gravis without (acute) exacerbation: Secondary | ICD-10-CM | POA: Diagnosis not present

## 2015-06-02 DIAGNOSIS — M12811 Other specific arthropathies, not elsewhere classified, right shoulder: Secondary | ICD-10-CM | POA: Diagnosis not present

## 2015-06-02 DIAGNOSIS — M5431 Sciatica, right side: Secondary | ICD-10-CM | POA: Diagnosis not present

## 2015-06-16 DIAGNOSIS — M7541 Impingement syndrome of right shoulder: Secondary | ICD-10-CM | POA: Diagnosis not present

## 2015-06-20 DIAGNOSIS — M7541 Impingement syndrome of right shoulder: Secondary | ICD-10-CM | POA: Diagnosis not present

## 2015-06-22 DIAGNOSIS — M4806 Spinal stenosis, lumbar region: Secondary | ICD-10-CM | POA: Diagnosis not present

## 2015-06-27 DIAGNOSIS — M7541 Impingement syndrome of right shoulder: Secondary | ICD-10-CM | POA: Diagnosis not present

## 2015-07-05 DIAGNOSIS — M4806 Spinal stenosis, lumbar region: Secondary | ICD-10-CM | POA: Diagnosis not present

## 2015-07-05 DIAGNOSIS — M7541 Impingement syndrome of right shoulder: Secondary | ICD-10-CM | POA: Diagnosis not present

## 2015-07-05 DIAGNOSIS — Z6825 Body mass index (BMI) 25.0-25.9, adult: Secondary | ICD-10-CM | POA: Diagnosis not present

## 2015-07-06 ENCOUNTER — Other Ambulatory Visit (HOSPITAL_COMMUNITY): Payer: Self-pay | Admitting: Neurological Surgery

## 2015-07-27 DIAGNOSIS — Z23 Encounter for immunization: Secondary | ICD-10-CM | POA: Diagnosis not present

## 2015-07-28 DIAGNOSIS — D485 Neoplasm of uncertain behavior of skin: Secondary | ICD-10-CM | POA: Diagnosis not present

## 2015-07-28 DIAGNOSIS — Z85828 Personal history of other malignant neoplasm of skin: Secondary | ICD-10-CM | POA: Diagnosis not present

## 2015-07-28 DIAGNOSIS — L814 Other melanin hyperpigmentation: Secondary | ICD-10-CM | POA: Diagnosis not present

## 2015-07-28 DIAGNOSIS — D1801 Hemangioma of skin and subcutaneous tissue: Secondary | ICD-10-CM | POA: Diagnosis not present

## 2015-07-28 DIAGNOSIS — L821 Other seborrheic keratosis: Secondary | ICD-10-CM | POA: Diagnosis not present

## 2015-07-28 DIAGNOSIS — Z23 Encounter for immunization: Secondary | ICD-10-CM | POA: Diagnosis not present

## 2015-07-28 DIAGNOSIS — D692 Other nonthrombocytopenic purpura: Secondary | ICD-10-CM | POA: Diagnosis not present

## 2015-07-28 DIAGNOSIS — D225 Melanocytic nevi of trunk: Secondary | ICD-10-CM | POA: Diagnosis not present

## 2015-07-28 DIAGNOSIS — L570 Actinic keratosis: Secondary | ICD-10-CM | POA: Diagnosis not present

## 2015-07-29 DIAGNOSIS — L988 Other specified disorders of the skin and subcutaneous tissue: Secondary | ICD-10-CM | POA: Diagnosis not present

## 2015-08-09 ENCOUNTER — Encounter (HOSPITAL_COMMUNITY)
Admission: RE | Admit: 2015-08-09 | Discharge: 2015-08-09 | Disposition: A | Discharge status: Home / Self Care | Payer: Medicare Other | Source: Ambulatory Visit | Attending: Neurological Surgery | Admitting: Neurological Surgery

## 2015-08-09 ENCOUNTER — Encounter (HOSPITAL_COMMUNITY): Payer: Self-pay

## 2015-08-09 ENCOUNTER — Other Ambulatory Visit (HOSPITAL_COMMUNITY): Payer: Self-pay | Admitting: *Deleted

## 2015-08-09 DIAGNOSIS — Z01818 Encounter for other preprocedural examination: Secondary | ICD-10-CM | POA: Diagnosis not present

## 2015-08-09 DIAGNOSIS — Z01812 Encounter for preprocedural laboratory examination: Secondary | ICD-10-CM | POA: Insufficient documentation

## 2015-08-09 DIAGNOSIS — R9431 Abnormal electrocardiogram [ECG] [EKG]: Secondary | ICD-10-CM | POA: Diagnosis not present

## 2015-08-09 DIAGNOSIS — Z79899 Other long term (current) drug therapy: Secondary | ICD-10-CM | POA: Insufficient documentation

## 2015-08-09 DIAGNOSIS — Z7952 Long term (current) use of systemic steroids: Secondary | ICD-10-CM | POA: Insufficient documentation

## 2015-08-09 DIAGNOSIS — Z7982 Long term (current) use of aspirin: Secondary | ICD-10-CM | POA: Insufficient documentation

## 2015-08-09 DIAGNOSIS — G7000 Myasthenia gravis without (acute) exacerbation: Secondary | ICD-10-CM | POA: Insufficient documentation

## 2015-08-09 HISTORY — DX: Malignant (primary) neoplasm, unspecified: C80.1

## 2015-08-09 HISTORY — DX: Pneumonia, unspecified organism: J18.9

## 2015-08-09 HISTORY — DX: Diverticulitis of intestine, part unspecified, without perforation or abscess without bleeding: K57.92

## 2015-08-09 HISTORY — DX: Barrett's esophagus without dysplasia: K22.70

## 2015-08-09 HISTORY — DX: Cramp and spasm: R25.2

## 2015-08-09 HISTORY — DX: Anemia, unspecified: D64.9

## 2015-08-09 HISTORY — DX: Unspecified osteoarthritis, unspecified site: M19.90

## 2015-08-09 HISTORY — DX: Personal history of other diseases of the digestive system: Z87.19

## 2015-08-09 HISTORY — DX: Myasthenia gravis without (acute) exacerbation: G70.00

## 2015-08-09 LAB — CBC WITH DIFFERENTIAL/PLATELET
Basophils Absolute: 0 10*3/uL (ref 0.0–0.1)
Basophils Relative: 0 %
EOS PCT: 0 %
Eosinophils Absolute: 0 10*3/uL (ref 0.0–0.7)
HCT: 47.2 % (ref 39.0–52.0)
Hemoglobin: 15.4 g/dL (ref 13.0–17.0)
LYMPHS ABS: 0.7 10*3/uL (ref 0.7–4.0)
LYMPHS PCT: 8 %
MCH: 31.4 pg (ref 26.0–34.0)
MCHC: 32.6 g/dL (ref 30.0–36.0)
MCV: 96.3 fL (ref 78.0–100.0)
MONO ABS: 0.7 10*3/uL (ref 0.1–1.0)
Monocytes Relative: 8 %
Neutro Abs: 7.3 10*3/uL (ref 1.7–7.7)
Neutrophils Relative %: 84 %
PLATELETS: 197 10*3/uL (ref 150–400)
RBC: 4.9 MIL/uL (ref 4.22–5.81)
RDW: 13.8 % (ref 11.5–15.5)
WBC: 8.8 10*3/uL (ref 4.0–10.5)

## 2015-08-09 LAB — BASIC METABOLIC PANEL
Anion gap: 10 (ref 5–15)
BUN: 26 mg/dL — AB (ref 6–20)
CHLORIDE: 105 mmol/L (ref 101–111)
CO2: 25 mmol/L (ref 22–32)
Calcium: 9.4 mg/dL (ref 8.9–10.3)
Creatinine, Ser: 1.2 mg/dL (ref 0.61–1.24)
GFR calc Af Amer: >60 mL/min (ref >60)
GFR calc non Af Amer: 58 mL/min — ABNORMAL LOW (ref >60)
GLUCOSE: 125 mg/dL — AB (ref 65–99)
POTASSIUM: 4.4 mmol/L (ref 3.5–5.1)
Sodium: 140 mmol/L (ref 135–145)

## 2015-08-09 LAB — SURGICAL PCR SCREEN
MRSA, PCR: NEGATIVE
STAPHYLOCOCCUS AUREUS: NEGATIVE

## 2015-08-09 LAB — PROTIME-INR
INR: 1.08 (ref 0.00–1.49)
Prothrombin Time: 14.2 seconds (ref 11.6–15.2)

## 2015-08-09 NOTE — Pre-Procedure Instructions (Signed)
Reginald Ford  08/09/2015      Your procedure is scheduled on Thursday, August 18, 2015 at 9:30 AM.   Report to [facility] Entrance "A" Admitting Office at 7:30 AM.   Call this number if you have problems the morning of surgery: [phone]   Any questions prior to day of surgery, please call [phone] between 8 & 4 PM.   Remember:  Do not eat food or drink liquids after midnight Wednesday, 08/17/15.  Take these medicines the morning of surgery with A SIP OF WATER: Omeprazole (Prilosec), Prednisone, Pyridostigmine (Mestinon)  Stop Aspirin 7 days prior to surgery.   Do not wear jewelry.  Do not wear lotions, powders, or cologne.  You may wear deodorant.  Men may shave face and neck.  Do not bring valuables to the hospital.  [facility] is not responsible for any belongings or valuables.  Contacts, dentures or bridgework may not be worn into surgery.  Leave your suitcase in the car.  After surgery it may be brought to your room.  For patients admitted to the hospital, discharge time will be determined by your treatment team.  Special instructions:  [facility] - Preparing for Surgery  Before surgery, you can play an important role.  Because skin is not sterile, your skin needs to be as free of germs as possible.  You can reduce the number of germs on you skin by washing with CHG (chlorahexidine gluconate) soap before surgery.  CHG is an antiseptic cleaner which kills germs and bonds with the skin to continue killing germs even after washing.  Please DO NOT use if you have an allergy to CHG or antibacterial soaps.  If your skin becomes reddened/irritated stop using the CHG and inform your nurse when you arrive at Short Stay.  Do not shave (including legs and underarms) for at least 48 hours prior to the first CHG shower.  You may shave your face.  Please follow these instructions carefully:   1.  Shower with CHG Soap the night before surgery and the                                 morning of Surgery.  2.  If you choose to wash your hair, wash your hair first as usual with your       normal shampoo.  3.  After you shampoo, rinse your hair and body thoroughly to remove the                      Shampoo.  4.  Use CHG as you would any other liquid soap.  You can apply chg directly       to the skin and wash gently with scrungie or a clean washcloth.  5.  Apply the CHG Soap to your body ONLY FROM THE NECK DOWN.        Do not use on open wounds or open sores.  Avoid contact with your eyes, ears, mouth and genitals (private parts).  Wash genitals (private parts) with your normal soap.  6.  Wash thoroughly, paying special attention to the area where your surgery        will be performed.  7.  Thoroughly rinse your body with warm water from the neck down.  8.  DO NOT shower/wash with your normal soap after using and rinsing off  the CHG Soap.  9.  Pat yourself dry with a clean towel.            10.  Wear clean pajamas.            11.  Place clean sheets on your bed the night of your first shower and do not        sleep with pets.  Day of Surgery  Do not apply any lotions the morning of surgery.  Please wear clean clothes to the hospital.   Please read over the following fact sheets that you were given. Pain Booklet, Coughing and Deep Breathing, MRSA Information and Surgical Site Infection Prevention

## 2015-08-09 NOTE — Progress Notes (Signed)
Pt denies cardiac history, chest pain or sob. Pt does have Myasthenia Gravis. He brought a list of drugs contraindicated for Myasthenia Gravis and I have sent that list to the pharmacy so they can put them in EPIC under Allergies/Contraindications. Pt said that he had an ECHO done 01/06/14 at Dr. Alma Downs office, but when I called and requested copy of it, they did not have it. They did have a CXR from 09/01/14 and will send a copy of the report to [place].   Pt is concerned about his immune system being compromised due to the higher doses of Prednisone that he is taking.

## 2015-08-10 NOTE — Progress Notes (Signed)
Anesthesia Chart Review:  Pt is 75 year old male scheduled for L4-5, L5-S1 laminectomy and foraminotomy on 08/18/2015 with Dr. Ronnald Ramp.   PCP is Dr. Herschell Dimes Shamleffer. Neurologist is Dr. Lovette Cliche (care everywhere).   PMH includes:  Myasthenia gravis, anemia, Barrett's esophagus. Never smoker. BMI 26.   Medications: ASA, prilosec, prednisone, mestinon.   Preoperative labs reviewed.    Chest x-ray 08/23/14 reviewed. Interim partial clearing of bibasilar subsegmental atelectasis.  EKG 08/09/15: NSR. Cannot rule out Anterior infarct, age undetermined  Spoke with Lorriane Shire in Dr. Ronnald Ramp' office. Due to his myasthenia gravis, per Dr. Ileene Rubens, pt is to increase his steroid dose for 1 week prior to surgery and for 1 week after surgery. Pt and Dr. Ronnald Ramp aware.   If no changes, I anticipate pt can proceed with surgery as scheduled.   Willeen Cass, FNP-BC [facility]/Anesthesiology Phone: [phone] 08/10/2015 3:06 PM

## 2015-08-17 NOTE — Anesthesia Preprocedure Evaluation (Addendum)
Anesthesia Evaluation  Patient identified by MRN, date of birth, ID band Patient awake    Reviewed: Allergy & Precautions, H&P , Patient's Chart, lab work & pertinent test results, reviewed documented beta blocker date and time   Airway Mallampati: II  TM Distance: >3 FB Neck ROM: full    Dental no notable dental hx.    Pulmonary    Pulmonary exam normal breath sounds clear to auscultation       Cardiovascular  Rhythm:regular Rate:Normal     Neuro/Psych  Neuromuscular disease    GI/Hepatic   Endo/Other    Renal/GU      Musculoskeletal   Abdominal   Peds  Hematology   Anesthesia Other Findings Barrett's esophagus  , takes Meds   History of hiatal hernia    Arthritis  shoulder ; partial rotator cuff tear; Can lift arms over head    Myasthenia gravis ; great arm strength  EKG: NSR, PRWP in V3, No cardiac Hx  Reproductive/Obstetrics                            Anesthesia Physical Anesthesia Plan  ASA: II  Anesthesia Plan: General   Post-op Pain Management:    Induction: Intravenous  Airway Management Planned: Oral ETT  Additional Equipment:   Intra-op Plan:   Post-operative Plan: Extubation in OR  Informed Consent: I have reviewed the patients History and Physical, chart, labs and discussed the procedure including the risks, benefits and alternatives for the proposed anesthesia with the patient or authorized representative who has indicated his/her understanding and acceptance.   Dental Advisory Given and Dental advisory given  Plan Discussed with: CRNA and Surgeon  Anesthesia Plan Comments: (  Discussed general anesthesia, including possible nausea, instrumentation of airway, sore throat,pulmonary aspiration, etc. I asked if the were any outstanding questions, or  concerns before we proceeded. )        Anesthesia Quick Evaluation

## 2015-08-18 ENCOUNTER — Encounter (HOSPITAL_COMMUNITY)
Admission: RE | Disposition: A | Discharge status: Home / Self Care | Payer: Self-pay | Source: Ambulatory Visit | Attending: Neurological Surgery

## 2015-08-18 ENCOUNTER — Ambulatory Visit (HOSPITAL_COMMUNITY)
Admission: RE | Admit: 2015-08-18 | Discharge: 2015-08-19 | Disposition: A | Discharge status: Home / Self Care | Payer: Medicare Other | Source: Ambulatory Visit | Attending: Neurological Surgery | Admitting: Neurological Surgery

## 2015-08-18 ENCOUNTER — Inpatient Hospital Stay (HOSPITAL_COMMUNITY): Payer: Medicare Other

## 2015-08-18 ENCOUNTER — Encounter (HOSPITAL_COMMUNITY): Payer: Self-pay | Admitting: Certified Registered"

## 2015-08-18 ENCOUNTER — Inpatient Hospital Stay (HOSPITAL_COMMUNITY): Payer: Medicare Other | Admitting: Emergency Medicine

## 2015-08-18 ENCOUNTER — Inpatient Hospital Stay (HOSPITAL_COMMUNITY): Payer: Medicare Other | Admitting: Anesthesiology

## 2015-08-18 DIAGNOSIS — M4806 Spinal stenosis, lumbar region: Secondary | ICD-10-CM | POA: Insufficient documentation

## 2015-08-18 DIAGNOSIS — Z9889 Other specified postprocedural states: Secondary | ICD-10-CM

## 2015-08-18 DIAGNOSIS — M5117 Intervertebral disc disorders with radiculopathy, lumbosacral region: Secondary | ICD-10-CM | POA: Diagnosis not present

## 2015-08-18 DIAGNOSIS — G7000 Myasthenia gravis without (acute) exacerbation: Secondary | ICD-10-CM | POA: Diagnosis not present

## 2015-08-18 DIAGNOSIS — Z79899 Other long term (current) drug therapy: Secondary | ICD-10-CM | POA: Insufficient documentation

## 2015-08-18 DIAGNOSIS — M5126 Other intervertebral disc displacement, lumbar region: Principal | ICD-10-CM | POA: Insufficient documentation

## 2015-08-18 DIAGNOSIS — M19019 Primary osteoarthritis, unspecified shoulder: Secondary | ICD-10-CM | POA: Diagnosis not present

## 2015-08-18 DIAGNOSIS — M1990 Unspecified osteoarthritis, unspecified site: Secondary | ICD-10-CM | POA: Diagnosis not present

## 2015-08-18 DIAGNOSIS — Z7982 Long term (current) use of aspirin: Secondary | ICD-10-CM | POA: Insufficient documentation

## 2015-08-18 DIAGNOSIS — D649 Anemia, unspecified: Secondary | ICD-10-CM | POA: Diagnosis not present

## 2015-08-18 DIAGNOSIS — K2270 Barrett's esophagus without dysplasia: Secondary | ICD-10-CM | POA: Diagnosis not present

## 2015-08-18 DIAGNOSIS — M48061 Spinal stenosis, lumbar region without neurogenic claudication: Secondary | ICD-10-CM

## 2015-08-18 DIAGNOSIS — Z01818 Encounter for other preprocedural examination: Secondary | ICD-10-CM | POA: Diagnosis not present

## 2015-08-18 HISTORY — PX: LUMBAR LAMINECTOMY/DECOMPRESSION MICRODISCECTOMY: SHX5026

## 2015-08-18 SURGERY — LUMBAR LAMINECTOMY/DECOMPRESSION MICRODISCECTOMY 2 LEVELS
Anesthesia: General | Site: Back | Laterality: Bilateral

## 2015-08-18 MED ORDER — PHENYLEPHRINE HCL 10 MG/ML IJ SOLN
INTRAMUSCULAR | Status: DC | PRN
  Administered 2015-08-18 (×2): 80 ug via INTRAVENOUS

## 2015-08-18 MED ORDER — PHENYLEPHRINE 40 MCG/ML (10ML) SYRINGE FOR IV PUSH (FOR BLOOD PRESSURE SUPPORT)
PREFILLED_SYRINGE | INTRAVENOUS | Status: AC
  Filled 2015-08-18: qty 10

## 2015-08-18 MED ORDER — PROPOFOL 500 MG/50ML IV EMUL
INTRAVENOUS | Status: DC | PRN
  Administered 2015-08-18: 50 ug/kg/min via INTRAVENOUS

## 2015-08-18 MED ORDER — LACTATED RINGERS IV SOLN
INTRAVENOUS | Status: DC
  Administered 2015-08-18 (×2): via INTRAVENOUS

## 2015-08-18 MED ORDER — SODIUM CHLORIDE 0.9 % IR SOLN
Status: DC | PRN
  Administered 2015-08-18: 10:00:00

## 2015-08-18 MED ORDER — ACETAMINOPHEN 160 MG/5ML PO SOLN
650.0000 mg | Freq: Once | ORAL | Status: DC
  Filled 2015-08-18: qty 20.3

## 2015-08-18 MED ORDER — SODIUM CHLORIDE 0.9 % IJ SOLN: 3.0000 mL | INTRAMUSCULAR | Status: DC | PRN

## 2015-08-18 MED ORDER — LIDOCAINE HCL (CARDIAC) 20 MG/ML IV SOLN
INTRAVENOUS | Status: AC
  Filled 2015-08-18: qty 5

## 2015-08-18 MED ORDER — ROCURONIUM BROMIDE 50 MG/5ML IV SOLN
INTRAVENOUS | Status: AC
  Filled 2015-08-18: qty 1

## 2015-08-18 MED ORDER — FENTANYL CITRATE (PF) 250 MCG/5ML IJ SOLN
INTRAMUSCULAR | Status: AC
  Filled 2015-08-18: qty 5

## 2015-08-18 MED ORDER — MIDAZOLAM HCL 2 MG/2ML IJ SOLN
INTRAMUSCULAR | Status: AC
  Filled 2015-08-18: qty 2

## 2015-08-18 MED ORDER — PROPOFOL 10 MG/ML IV BOLUS
INTRAVENOUS | Status: DC | PRN
  Administered 2015-08-18: 50 mg via INTRAVENOUS
  Administered 2015-08-18: 150 mg via INTRAVENOUS

## 2015-08-18 MED ORDER — OXYCODONE-ACETAMINOPHEN 5-325 MG PO TABS
1.0000 | ORAL_TABLET | ORAL | Status: DC | PRN
  Administered 2015-08-18 – 2015-08-19 (×3): 2 via ORAL
  Filled 2015-08-18 (×3): qty 2

## 2015-08-18 MED ORDER — SODIUM CHLORIDE 0.9 % IJ SOLN
INTRAMUSCULAR | Status: AC
  Filled 2015-08-18: qty 10

## 2015-08-18 MED ORDER — DEXAMETHASONE SODIUM PHOSPHATE 10 MG/ML IJ SOLN
10.0000 mg | INTRAMUSCULAR | Status: AC
  Administered 2015-08-18: 10 mg via INTRAVENOUS

## 2015-08-18 MED ORDER — MIDAZOLAM HCL 5 MG/5ML IJ SOLN
INTRAMUSCULAR | Status: DC | PRN
  Administered 2015-08-18: 2 mg via INTRAVENOUS

## 2015-08-18 MED ORDER — DEXAMETHASONE SODIUM PHOSPHATE 4 MG/ML IJ SOLN: 4.0000 mg | Freq: Four times a day (QID) | INTRAMUSCULAR | Status: DC

## 2015-08-18 MED ORDER — MORPHINE SULFATE (PF) 2 MG/ML IV SOLN: 1.0000 mg | INTRAVENOUS | Status: DC | PRN

## 2015-08-18 MED ORDER — LIDOCAINE HCL (CARDIAC) 20 MG/ML IV SOLN
INTRAVENOUS | Status: DC | PRN
  Administered 2015-08-18: 100 mg via INTRAVENOUS

## 2015-08-18 MED ORDER — PREDNISONE 10 MG PO TABS: 20.0000 mg | ORAL_TABLET | Freq: Every day | ORAL | Status: DC

## 2015-08-18 MED ORDER — PHENYLEPHRINE HCL 10 MG/ML IJ SOLN
10.0000 mg | INTRAVENOUS | Status: DC | PRN
  Administered 2015-08-18: 30 ug/min via INTRAVENOUS

## 2015-08-18 MED ORDER — HEMOSTATIC AGENTS (NO CHARGE) OPTIME
TOPICAL | Status: DC | PRN
  Administered 2015-08-18: 1 via TOPICAL

## 2015-08-18 MED ORDER — EPHEDRINE SULFATE 50 MG/ML IJ SOLN
INTRAMUSCULAR | Status: AC
  Filled 2015-08-18: qty 1

## 2015-08-18 MED ORDER — PROPOFOL 10 MG/ML IV BOLUS
INTRAVENOUS | Status: AC
  Filled 2015-08-18: qty 20

## 2015-08-18 MED ORDER — DEXAMETHASONE 4 MG PO TABS: 4.0000 mg | ORAL_TABLET | Freq: Four times a day (QID) | ORAL | Status: DC

## 2015-08-18 MED ORDER — POTASSIUM CHLORIDE IN NACL 20-0.9 MEQ/L-% IV SOLN
INTRAVENOUS | Status: DC
  Filled 2015-08-18 (×3): qty 1000

## 2015-08-18 MED ORDER — BUPIVACAINE HCL (PF) 0.25 % IJ SOLN
INTRAMUSCULAR | Status: DC | PRN
  Administered 2015-08-18: 6 mg

## 2015-08-18 MED ORDER — FENTANYL CITRATE (PF) 100 MCG/2ML IJ SOLN: 25.0000 ug | INTRAMUSCULAR | Status: DC | PRN

## 2015-08-18 MED ORDER — ONDANSETRON HCL 4 MG/2ML IJ SOLN
INTRAMUSCULAR | Status: DC | PRN
  Administered 2015-08-18: 4 mg via INTRAVENOUS

## 2015-08-18 MED ORDER — ARTIFICIAL TEARS OP OINT
TOPICAL_OINTMENT | OPHTHALMIC | Status: AC
  Filled 2015-08-18: qty 3.5

## 2015-08-18 MED ORDER — PANTOPRAZOLE SODIUM 40 MG PO TBEC
40.0000 mg | DELAYED_RELEASE_TABLET | Freq: Every day | ORAL | Status: DC
  Administered 2015-08-18 – 2015-08-19 (×2): 40 mg via ORAL
  Filled 2015-08-18 (×2): qty 1

## 2015-08-18 MED ORDER — ASPIRIN EC 81 MG PO TBEC
81.0000 mg | DELAYED_RELEASE_TABLET | Freq: Every day | ORAL | Status: DC
  Administered 2015-08-18 – 2015-08-19 (×2): 81 mg via ORAL
  Filled 2015-08-18 (×2): qty 1

## 2015-08-18 MED ORDER — ACETAMINOPHEN 650 MG RE SUPP: 650.0000 mg | RECTAL | Status: DC | PRN

## 2015-08-18 MED ORDER — 0.9 % SODIUM CHLORIDE (POUR BTL) OPTIME
TOPICAL | Status: DC | PRN
  Administered 2015-08-18: 1000 mL

## 2015-08-18 MED ORDER — MENTHOL 3 MG MT LOZG
1.0000 | LOZENGE | OROMUCOSAL | Status: DC | PRN
  Filled 2015-08-18 (×2): qty 9

## 2015-08-18 MED ORDER — ACETAMINOPHEN 325 MG PO TABS: 650.0000 mg | ORAL_TABLET | ORAL | Status: DC | PRN

## 2015-08-18 MED ORDER — CEFAZOLIN SODIUM-DEXTROSE 2-3 GM-% IV SOLR
2.0000 g | INTRAVENOUS | Status: AC
  Administered 2015-08-18: 2 g via INTRAVENOUS
  Filled 2015-08-18: qty 50

## 2015-08-18 MED ORDER — PYRIDOSTIGMINE BROMIDE 60 MG PO TABS
120.0000 mg | ORAL_TABLET | Freq: Four times a day (QID) | ORAL | Status: DC
  Administered 2015-08-18 – 2015-08-19 (×4): 120 mg via ORAL
  Filled 2015-08-18 (×7): qty 2

## 2015-08-18 MED ORDER — FENTANYL CITRATE (PF) 250 MCG/5ML IJ SOLN
INTRAMUSCULAR | Status: DC | PRN
  Administered 2015-08-18: 150 ug via INTRAVENOUS

## 2015-08-18 MED ORDER — DEXAMETHASONE 4 MG PO TABS
4.0000 mg | ORAL_TABLET | Freq: Four times a day (QID) | ORAL | Status: DC
  Administered 2015-08-18 – 2015-08-19 (×3): 4 mg via ORAL
  Filled 2015-08-18 (×3): qty 1

## 2015-08-18 MED ORDER — CEFAZOLIN SODIUM 1-5 GM-% IV SOLN
1.0000 g | Freq: Three times a day (TID) | INTRAVENOUS | Status: AC
  Administered 2015-08-18 – 2015-08-19 (×2): 1 g via INTRAVENOUS
  Filled 2015-08-18 (×2): qty 50

## 2015-08-18 MED ORDER — DEXAMETHASONE SODIUM PHOSPHATE 10 MG/ML IJ SOLN
INTRAMUSCULAR | Status: AC
  Filled 2015-08-18: qty 1

## 2015-08-18 MED ORDER — ONDANSETRON HCL 4 MG/2ML IJ SOLN: 4.0000 mg | INTRAMUSCULAR | Status: DC | PRN

## 2015-08-18 MED ORDER — PHENOL 1.4 % MT LIQD
1.0000 | OROMUCOSAL | Status: DC | PRN
Start: 2015-08-18 — End: 2015-08-19

## 2015-08-18 MED ORDER — SUCCINYLCHOLINE CHLORIDE 20 MG/ML IJ SOLN
INTRAMUSCULAR | Status: AC
  Filled 2015-08-18: qty 1

## 2015-08-18 MED ORDER — SODIUM CHLORIDE 0.9 % IJ SOLN
3.0000 mL | Freq: Two times a day (BID) | INTRAMUSCULAR | Status: DC
  Administered 2015-08-18 (×2): 3 mL via INTRAVENOUS

## 2015-08-18 MED ORDER — PANTOPRAZOLE SODIUM 40 MG IV SOLR: 40.0000 mg | Freq: Every day | INTRAVENOUS | Status: DC

## 2015-08-18 SURGICAL SUPPLY — 39 items
BAG DECANTER FOR FLEXI CONT (MISCELLANEOUS) ×3 IMPLANT
BENZOIN TINCTURE PRP APPL 2/3 (GAUZE/BANDAGES/DRESSINGS) ×3 IMPLANT
BUR MATCHSTICK NEURO 3.0 LAGG (BURR) ×3 IMPLANT
CANISTER SUCT 3000ML PPV (MISCELLANEOUS) ×3 IMPLANT
CLOSURE WOUND 1/2 X4 (GAUZE/BANDAGES/DRESSINGS) ×1
CORDS BIPOLAR (ELECTRODE) ×3 IMPLANT
DRAPE LAPAROTOMY 100X72X124 (DRAPES) ×3 IMPLANT
DRAPE MICROSCOPE LEICA (MISCELLANEOUS) ×3 IMPLANT
DRAPE POUCH INSTRU U-SHP 10X18 (DRAPES) ×3 IMPLANT
DRAPE SURG 17X23 STRL (DRAPES) ×3 IMPLANT
DRSG OPSITE POSTOP 3X4 (GAUZE/BANDAGES/DRESSINGS) ×3 IMPLANT
DURAPREP 26ML APPLICATOR (WOUND CARE) ×3 IMPLANT
ELECT REM PT RETURN 9FT ADLT (ELECTROSURGICAL) ×3
ELECTRODE REM PT RTRN 9FT ADLT (ELECTROSURGICAL) ×1 IMPLANT
GAUZE SPONGE 4X4 16PLY XRAY LF (GAUZE/BANDAGES/DRESSINGS) IMPLANT
GLOVE BIO SURGEON STRL SZ8 (GLOVE) ×3 IMPLANT
GOWN STRL REUS W/ TWL LRG LVL3 (GOWN DISPOSABLE) IMPLANT
GOWN STRL REUS W/ TWL XL LVL3 (GOWN DISPOSABLE) ×1 IMPLANT
GOWN STRL REUS W/TWL 2XL LVL3 (GOWN DISPOSABLE) IMPLANT
GOWN STRL REUS W/TWL LRG LVL3 (GOWN DISPOSABLE)
GOWN STRL REUS W/TWL XL LVL3 (GOWN DISPOSABLE) ×2
HEMOSTAT POWDER KIT SURGIFOAM (HEMOSTASIS) IMPLANT
KIT BASIN OR (CUSTOM PROCEDURE TRAY) ×3 IMPLANT
KIT ROOM TURNOVER OR (KITS) ×3 IMPLANT
NEEDLE HYPO 25X1 1.5 SAFETY (NEEDLE) ×3 IMPLANT
NEEDLE SPNL 20GX3.5 QUINCKE YW (NEEDLE) IMPLANT
NS IRRIG 1000ML POUR BTL (IV SOLUTION) ×3 IMPLANT
PACK LAMINECTOMY NEURO (CUSTOM PROCEDURE TRAY) ×3 IMPLANT
PAD ARMBOARD 7.5X6 YLW CONV (MISCELLANEOUS) ×9 IMPLANT
RUBBERBAND STERILE (MISCELLANEOUS) ×6 IMPLANT
SPONGE SURGIFOAM ABS GEL SZ50 (HEMOSTASIS) ×3 IMPLANT
STRIP CLOSURE SKIN 1/2X4 (GAUZE/BANDAGES/DRESSINGS) ×2 IMPLANT
SUT VIC AB 0 CT1 18XCR BRD8 (SUTURE) ×1 IMPLANT
SUT VIC AB 0 CT1 8-18 (SUTURE) ×2
SUT VIC AB 2-0 CP2 18 (SUTURE) ×3 IMPLANT
SUT VIC AB 3-0 SH 8-18 (SUTURE) ×3 IMPLANT
TOWEL OR 17X24 6PK STRL BLUE (TOWEL DISPOSABLE) ×3 IMPLANT
TOWEL OR 17X26 10 PK STRL BLUE (TOWEL DISPOSABLE) ×3 IMPLANT
WATER STERILE IRR 1000ML POUR (IV SOLUTION) ×3 IMPLANT

## 2015-08-18 NOTE — Anesthesia Postprocedure Evaluation (Signed)
Anesthesia Post Note  Patient: Reginald Ford  Procedure(s) Performed: Procedure(s) (LRB): Laminectomy and Foraminotomy - L4-L5 - L5-S1 - bilateral (Bilateral)  Patient location during evaluation: PACU Anesthesia Type: General Level of consciousness: sedated Pain management: satisfactory to patient Vital Signs Assessment: post-procedure vital signs reviewed and stable Respiratory status: spontaneous breathing Cardiovascular status: stable Anesthetic complications: no Comments: No shoulder pain..  Feels well    Last Vitals:  Filed Vitals:   08/18/15 1200 08/18/15 1215  BP: 139/76 132/86  Pulse: 71 64  Temp:  36.3 C  Resp: 24 35    Last Pain:  Filed Vitals:   08/18/15 1220  PainSc: 0-No pain    LLE Motor Response: Purposeful movement;Responds to commands (08/18/15 1215) LLE Sensation: No numbness (08/18/15 1215) RLE Motor Response: Purposeful movement;Responds to commands (08/18/15 1215) RLE Sensation: No numbness (08/18/15 1215)      Lyndle Herrlich EDWARD

## 2015-08-18 NOTE — Transfer of Care (Signed)
Immediate Anesthesia Transfer of Care Note  Patient: Reginald Ford  Procedure(s) Performed: Procedure(s): Laminectomy and Foraminotomy - L4-L5 - L5-S1 - bilateral (Bilateral)  Patient Location: PACU  Anesthesia Type:General  Level of Consciousness: awake, alert , oriented and patient cooperative  Airway & Oxygen Therapy: Patient Spontanous Breathing and Patient connected to nasal cannula oxygen  Post-op Assessment: Report given to RN, Post -op Vital signs reviewed and stable and Patient moving all extremities  Post vital signs: Reviewed and stable  Last Vitals:  Filed Vitals:   08/18/15 0747  BP: 161/90  Pulse: 62  Temp: 0000000 C    Complications: No apparent anesthesia complications

## 2015-08-18 NOTE — H&P (Signed)
Subjective: Patient is a 75 y.o. male admitted for stenosis. Onset of symptoms was several months ago, gradually worsening since that time.  The pain is rated severe, and is located at the across the lower back and radiates to RLE. The pain is described as aching and occurs intermittently. The symptoms have been progressive. Symptoms are exacerbated by exercise. MRI or CT showed stenosis   Past Medical History  Diagnosis Date  . Pneumonia     as a child  . Barrett's esophagus   . History of hiatal hernia   . Arthritis     shoulder  . Cancer ([place])     basal cell cancer  . Anemia     hx of iron infusions - 01/2014 and 01/2015  . Diverticulitis   . Cramps, muscle, general     if he gets dehyrated  . Myasthenia gravis ([address])     "ocular" myasthenia gravis    Past Surgical History  Procedure Laterality Date  . Colostomy  02/2012    for Diverticulitis  . Colostomy reversal  05/2012  . Colonoscopy    . Vasectomy      Prior to Admission medications   Medication Sig Start Date End Date Taking? Authorizing Provider  aspirin EC 81 MG tablet Take 81 mg by mouth daily.   Yes Historical Provider, MD  omeprazole (PRILOSEC) 20 MG capsule Take 20 mg by mouth daily.   Yes Historical Provider, MD  predniSONE (DELTASONE) 10 MG tablet Take 10-20 mg by mouth daily. 20 mg by mouth a week before and after surgery 05/12/15  Yes Historical Provider, MD  pyridostigmine (MESTINON) 60 MG tablet Take 120 mg by mouth 4 (four) times daily. 07/04/15  Yes Historical Provider, MD   Allergies  Allergen Reactions  . Aminoglycosides Other (See Comments)    Medication may cause weakness from myasthenia gravis  . Beta Adrenergic Blockers Other (See Comments)    Medication may cause weakness from myasthenia gravis  . Botulinum Toxins Other (See Comments)    Medication may cause weakness from myasthenia gravis  . Calcium Channel Blockers Other (See Comments)    Medication may cause weakness from myasthenia gravis   . Curare [Tubocurarine] Other (See Comments)    Medication may cause weakness from myasthenia gravis  . Interferons Other (See Comments)    Medication may cause weakness from myasthenia gravis  . Iodinated Diagnostic Agents Other (See Comments)    Medication may cause weakness from myasthenia gravis  . Lithium Other (See Comments)    Medication may cause Weakness from myasthenia gravis  . Macrolides And Ketolides Other (See Comments)    Medication may cause weakness from myastenia gravis  . Other Other (See Comments)    Magnesium salts, Fluoroquinolones  . Penicillamine Other (See Comments)    Medication may cause weakness from myasthenia gravis  . Quinine Derivatives Other (See Comments)    Medication may cause weakness from myastenia gravis  . Statins Other (See Comments)    Medication may cause weakness from myasthenia gravis    Social History  Substance Use Topics  . Smoking status: Never Smoker   . Smokeless tobacco: Never Used  . Alcohol Use: No    Family History  Problem Relation Age of Onset  . Hypertension Mother   . Hypotension Father      Review of Systems  Positive ROS: neg  All other systems have been reviewed and were otherwise negative with the exception of those mentioned in the HPI and as  above.  Objective: Vital signs in last 24 hours: Temp:  [97.2 F (36.2 C)] 97.2 F (36.2 C) (01/19 0747) Pulse Rate:  [62] 62 (01/19 0747) BP: (161)/(90) 161/90 mmHg (01/19 0747) SpO2:  [98 %] 98 % (01/19 0747) Weight:  [78.472 kg (173 lb)] 78.472 kg (173 lb) (01/19 0747)  General Appearance: Alert, cooperative, no distress, appears stated age Head: Normocephalic, without obvious abnormality, atraumatic Eyes: PERRL, conjunctiva/corneas clear, EOM's intact    Neck: Supple, symmetrical, trachea midline Back: Symmetric, no curvature, ROM normal, no CVA tenderness Lungs:  respirations unlabored Heart: Regular rate and rhythm Abdomen: Soft, non-tender Extremities:  Extremities normal, atraumatic, no cyanosis or edema Pulses: 2+ and symmetric all extremities Skin: Skin color, texture, turgor normal, no rashes or lesions  NEUROLOGIC:   Mental status: Alert and oriented x4,  no aphasia, good attention span, fund of knowledge, and memory Motor Exam - grossly normal Sensory Exam - grossly normal Reflexes: 1+ Coordination - grossly normal Gait - grossly normal Balance - grossly normal Cranial Nerves: I: smell Not tested  II: visual acuity  OS: nl    OD: nl  II: visual fields Full to confrontation  II: pupils Equal, round, reactive to light  III,VII: ptosis None  III,IV,VI: extraocular muscles  Full ROM  V: mastication Normal  V: facial light touch sensation  Normal  V,VII: corneal reflex  Present  VII: facial muscle function - upper  Normal  VII: facial muscle function - lower Normal  VIII: hearing Not tested  IX: soft palate elevation  Normal  IX,X: gag reflex Present  XI: trapezius strength  5/5  XI: sternocleidomastoid strength 5/5  XI: neck flexion strength  5/5  XII: tongue strength  Normal    Data Review Lab Results  Component Value Date   WBC 8.8 08/09/2015   HGB 15.4 08/09/2015   HCT 47.2 08/09/2015   MCV 96.3 08/09/2015   PLT 197 08/09/2015   Lab Results  Component Value Date   NA 140 08/09/2015   K 4.4 08/09/2015   CL 105 08/09/2015   CO2 25 08/09/2015   BUN 26* 08/09/2015   CREATININE 1.20 08/09/2015   GLUCOSE 125* 08/09/2015   Lab Results  Component Value Date   INR 1.08 08/09/2015    Assessment/Plan: Patient admitted for LL L4-5, L5-S1. Patient has failed a reasonable attempt at conservative therapy.  I explained the condition and procedure to the patient and answered any questions.  Patient wishes to proceed with procedure as planned. Understands risks/ benefits and typical outcomes of procedure.   Shanina Kepple S 08/18/2015 9:31 AM

## 2015-08-18 NOTE — Op Note (Signed)
08/18/2015  11:05 AM  PATIENT:  Reginald Ford  75 y.o. 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 Arnoldo Morale  ANESTHESIA:   General  EBL: 50 ml  Total I/O In: 1200 [I.V.:1200] Out: 250 [Urine:200; Blood:50]  BLOOD ADMINISTERED:none  DRAINS: none   SPECIMEN:  No Specimen  INDICATION FOR PROCEDURE: This patient presented with back and right leg pain that was chronic. Pain interfered with her quality of life. MRI showed spinal stenosis at L4-5 and L5-S1. He had a herniated disc at L5-S1 on the right. I recommended decompressive laminectomy and possible discectomy. Patient understood the risks, benefits, and alternatives and potential outcomes and wished to proceed.  PROCEDURE DETAILS: The patient was taken to the operating room and after induction of adequate generalized endotracheal anesthesia, the patient was rolled into the prone position on the Wilson frame and all pressure points were padded. The lumbar region was cleaned and then prepped with DuraPrep and draped in the usual sterile fashion. 5 cc of local anesthesia was injected and then a dorsal midline incision was made and carried down to the lumbo sacral fascia. The fascia was opened and the paraspinous musculature was taken down in a subperiosteal fashion to expose L4-5 and L5-S1 on the right. Intraoperative x-ray confirmed my level, and then I used a combination of the high-speed drill and the Kerrison punches to perform a hemilaminectomy, medial facetectomy, and foraminotomy at L4-5 and L5-S1 on the right. The underlying yellow ligament was opened and removed  in a piecemeal fashion to expose the underlying dura and exiting nerve root. I undercut the lateral recess and dissected down until I was medial to and distal to the pedicle. The nerve root was well decompressed. We then gently retracted the nerve root medially at L5-S1 on the right with a retractor, coagulated the epidural venous vasculature, and incised the inferior fragment. I removed several pieces of disc with a nerve hook and pituitary rongeur. I then palpated with a coronary dilator along the nerve root and into the foramen to assure adequate decompression. The annulus was sagging in the midline I felt no more compression of the nerve root. I irrigated with saline solution containing bacitracin. Achieved hemostasis with bipolar cautery, lined the dura with Gelfoam, and then closed the fascia with 0 Vicryl. I closed the subcutaneous tissues with 2-0 Vicryl and the subcuticular tissues with 3-0 Vicryl. The skin was then closed with benzoin and Steri-Strips. The drapes were removed, a sterile dressing was applied. The patient was awakened from general anesthesia and transferred to the recovery room in stable condition. At the end of the procedure all sponge, needle and instrument counts were correct.   PLAN OF CARE: Admit for overnight observation  PATIENT DISPOSITION:  PACU - hemodynamically stable.   Delay start of Pharmacological VTE agent (>24hrs) due to surgical blood loss or risk of bleeding:  yes

## 2015-08-18 NOTE — Anesthesia Procedure Notes (Signed)
Procedure Name: Intubation Date/Time: 08/18/2015 9:44 AM Performed by: Julian Reil Pre-anesthesia Checklist: Patient identified, Emergency Drugs available, Suction available and Patient being monitored Patient Re-evaluated:Patient Re-evaluated prior to inductionOxygen Delivery Method: Circle system utilized Preoxygenation: Pre-oxygenation with 100% oxygen Intubation Type: IV induction Ventilation: Mask ventilation without difficulty Laryngoscope Size: Mac and 4 Grade View: Grade I Tube type: Oral Tube size: 7.5 mm Number of attempts: 1 Airway Equipment and Method: Stylet Placement Confirmation: ETT inserted through vocal cords under direct vision,  positive ETCO2 and breath sounds checked- equal and bilateral Secured at: 22 cm Tube secured with: Tape Dental Injury: Teeth and Oropharynx as per pre-operative assessment

## 2015-08-19 ENCOUNTER — Encounter (HOSPITAL_COMMUNITY): Payer: Self-pay | Admitting: Neurological Surgery

## 2015-08-19 DIAGNOSIS — Z7982 Long term (current) use of aspirin: Secondary | ICD-10-CM | POA: Diagnosis not present

## 2015-08-19 DIAGNOSIS — Z79899 Other long term (current) drug therapy: Secondary | ICD-10-CM | POA: Diagnosis not present

## 2015-08-19 DIAGNOSIS — M4806 Spinal stenosis, lumbar region: Secondary | ICD-10-CM | POA: Diagnosis not present

## 2015-08-19 DIAGNOSIS — M5126 Other intervertebral disc displacement, lumbar region: Secondary | ICD-10-CM | POA: Diagnosis not present

## 2015-08-19 DIAGNOSIS — G7000 Myasthenia gravis without (acute) exacerbation: Secondary | ICD-10-CM | POA: Diagnosis not present

## 2015-08-19 DIAGNOSIS — M19019 Primary osteoarthritis, unspecified shoulder: Secondary | ICD-10-CM | POA: Diagnosis not present

## 2015-08-19 MED ORDER — OXYCODONE-ACETAMINOPHEN 5-325 MG PO TABS: 1.0000 | ORAL_TABLET | ORAL | Status: DC | PRN

## 2015-08-19 NOTE — Progress Notes (Signed)
Patient alert and oriented, mae's well, voiding adequate amount of urine, swallowing without difficulty, no c/o pain. Patient discharged home with family. Script and discharged instructions given to patient. Patient and family stated understanding of d/c instructions given and has an appointment with MD. 

## 2015-08-19 NOTE — Discharge Instructions (Signed)
Wound Care Keep incision covered and dry for 3 DAYS.  If you shower prior to then, cover incision with plastic wrap.  You may remove outer bandage after 3 DAYS Do not put any creams, lotions, or ointments on incision. Leave steri-strips on back.  They will fall off by themselves. Activity Walk each and every day, increasing distance each day. No lifting greater than 8 lbs.  Avoid bending, arching, or twisting. No driving for 2 weeks; may ride as a passenger locally.  Diet Resume your normal diet.  Return to Work Will be discussed at you follow up appointment. Call Your Doctor If Any of These Occur Redness, drainage, or swelling at the wound.  Temperature greater than 101 degrees. Severe pain not relieved by pain medication. Incision starts to come apart. Follow Up Appt Call today for appointment in 1-2 weeks [id number]) or for problems.  If you have any hardware placed in your spine, you will need an x-ray before your appointment.

## 2015-08-19 NOTE — Discharge Summary (Signed)
Physician Discharge Summary  Patient ID: Reginald Ford MRN: [id number] DOB/AGE: 01-Sep-1940 75 y.o.  Admit date: 08/18/2015 Discharge date: 08/19/2015  Admission Diagnoses: lumbar stenosis/ HNP   Discharge Diagnoses: same   Discharged Condition: good  Hospital Course: The patient was admitted on 08/18/2015 and taken to the operating room where the patient underwent DLL/ microdiskectomy. The patient tolerated the procedure well and was taken to the recovery room and then to the floor in stable condition. The hospital course was routine. There were no complications. The wound remained clean dry and intact. Pt had appropriate back soreness. No complaints of leg pain or new N/T/W. The patient remained afebrile with stable vital signs, and tolerated a regular diet. The patient continued to increase activities, and pain was well controlled with oral pain medications.   Consults: None  Significant Diagnostic Studies:  Results for orders placed or performed during the hospital encounter of 08/09/15  Surgical pcr screen  Result Value Ref Range   MRSA, PCR NEGATIVE NEGATIVE   Staphylococcus aureus NEGATIVE NEGATIVE  Basic metabolic panel  Result Value Ref Range   Sodium 140 135 - 145 mmol/L   Potassium 4.4 3.5 - 5.1 mmol/L   Chloride 105 101 - 111 mmol/L   CO2 25 22 - 32 mmol/L   Glucose, Bld 125 (H) 65 - 99 mg/dL   BUN 26 (H) 6 - 20 mg/dL   Creatinine, Ser 1.20 0.61 - 1.24 mg/dL   Calcium 9.4 8.9 - 10.3 mg/dL   GFR calc non Af Amer 58 (L) >60 mL/min   GFR calc Af Amer >60 >60 mL/min   Anion gap 10 5 - 15  CBC WITH DIFFERENTIAL  Result Value Ref Range   WBC 8.8 4.0 - 10.5 K/uL   RBC 4.90 4.22 - 5.81 MIL/uL   Hemoglobin 15.4 13.0 - 17.0 g/dL   HCT 47.2 39.0 - 52.0 %   MCV 96.3 78.0 - 100.0 fL   MCH 31.4 26.0 - 34.0 pg   MCHC 32.6 30.0 - 36.0 g/dL   RDW 13.8 11.5 - 15.5 %   Platelets 197 150 - 400 K/uL   Neutrophils Relative % 84 %   Neutro Abs 7.3 1.7 - 7.7 K/uL   Lymphocytes  Relative 8 %   Lymphs Abs 0.7 0.7 - 4.0 K/uL   Monocytes Relative 8 %   Monocytes Absolute 0.7 0.1 - 1.0 K/uL   Eosinophils Relative 0 %   Eosinophils Absolute 0.0 0.0 - 0.7 K/uL   Basophils Relative 0 %   Basophils Absolute 0.0 0.0 - 0.1 K/uL  Protime-INR  Result Value Ref Range   Prothrombin Time 14.2 11.6 - 15.2 seconds   INR 1.08 0.00 - 1.49    Chest 2 View  08/18/2015  CLINICAL DATA:  Preop.  Lumbar spinal stenosis. EXAM: CHEST  2 VIEW COMPARISON:  08/23/2014 FINDINGS: The cardiomediastinal silhouette is within normal limits. The lungs are well inflated without evidence of airspace consolidation, edema, pleural effusion, or pneumothorax. Two small nodular densities project over both lower lungs on the PA image, measuring 7-8 mm in diameter and not clearly present on prior studies though favored to reflect nipple shadows. No acute osseous abnormality is seen. IMPRESSION: 1. No evidence of active cardiopulmonary disease. 2. Small densities projecting over both lung bases favored to reflect nipple shadows rather than lung nodules. Repeat PA radiograph with nipple markers could be used to confirm. Electronically Signed   By: Logan Bores M.D.   On: 08/18/2015 08:31  Dg Lumbar Spine 1 View  08/18/2015  CLINICAL DATA:  OPENING IMAGE FOR LEVEL CONFIRMATION Laminectomy and Foraminotomy - L4-L5 - L5-S1 - bilateral RSTO: [facility] EXAM: LUMBAR SPINE - 1 VIEW COMPARISON:  MRI 05/26/2015 FINDINGS: FILM #1: Posterior retractor is in place. A surgical instrument is identified posterior to the disc space at L5-S1. IMPRESSION: Intraoperative localization. Electronically Signed   By: Nolon Nations M.D.   On: 08/18/2015 11:55    Antibiotics:  Anti-infectives    Start     Dose/Rate Route Frequency Ordered Stop   08/18/15 1800  ceFAZolin (ANCEF) IVPB 1 g/50 mL premix     1 g 100 mL/hr over 30 Minutes Intravenous Every 8 hours 08/18/15 1300 08/19/15 0143   08/18/15 1001  bacitracin 50,000 Units in sodium  chloride irrigation 0.9 % 500 mL irrigation  Status:  Discontinued       As needed 08/18/15 1001 08/18/15 1109   08/18/15 0746  ceFAZolin (ANCEF) IVPB 2 g/50 mL premix     2 g 100 mL/hr over 30 Minutes Intravenous On call to O.R. 08/18/15 0746 08/18/15 0959      Discharge Exam: Blood pressure 128/72, pulse 67, temperature 98 F (36.7 C), temperature source Oral, resp. rate 18, height 5\' 9"  (1.753 m), weight 78.472 kg (173 lb), SpO2 95 %. Neurologic: Grossly normal Dressing dry  Discharge Medications:     Medication List    TAKE these medications        aspirin EC 81 MG tablet  Take 81 mg by mouth daily.     omeprazole 20 MG capsule  Commonly known as:  PRILOSEC  Take 20 mg by mouth daily.     oxyCODONE-acetaminophen 5-325 MG tablet  Commonly known as:  PERCOCET/ROXICET  Take 1 tablet by mouth every 4 (four) hours as needed for moderate pain.     predniSONE 10 MG tablet  Commonly known as:  DELTASONE  Take 10 mg by mouth daily. 20 mg by mouth a week before and after surgery     pyridostigmine 60 MG tablet  Commonly known as:  MESTINON  Take 120 mg by mouth 4 (four) times daily.        Disposition: home   Final Dx: DLL/ microdiskectomy L4-5, L5-S1      Discharge Instructions     Remove dressing in 72 hours    Complete by:  As directed      Call MD for:  difficulty breathing, headache or visual disturbances    Complete by:  As directed      Call MD for:  persistant nausea and vomiting    Complete by:  As directed      Call MD for:  redness, tenderness, or signs of infection (pain, swelling, redness, odor or green/yellow discharge around incision site)    Complete by:  As directed      Call MD for:  severe uncontrolled pain    Complete by:  As directed      Call MD for:  temperature >100.4    Complete by:  As directed      Diet - low sodium heart healthy    Complete by:  As directed      Discharge instructions    Complete by:  As directed   May shower, no  bending or twisting or heavy lifting, limit sitting     Increase activity slowly    Complete by:  As directed  Signed: JONES,DAVID S 08/19/2015, 8:29 AM

## 2015-10-03 DIAGNOSIS — M7541 Impingement syndrome of right shoulder: Secondary | ICD-10-CM | POA: Diagnosis not present

## 2015-10-06 DIAGNOSIS — G7000 Myasthenia gravis without (acute) exacerbation: Secondary | ICD-10-CM | POA: Diagnosis not present

## 2015-10-06 DIAGNOSIS — K219 Gastro-esophageal reflux disease without esophagitis: Secondary | ICD-10-CM | POA: Diagnosis not present

## 2015-10-06 DIAGNOSIS — R609 Edema, unspecified: Secondary | ICD-10-CM | POA: Diagnosis not present

## 2015-10-06 DIAGNOSIS — D509 Iron deficiency anemia, unspecified: Secondary | ICD-10-CM | POA: Diagnosis not present

## 2015-10-06 DIAGNOSIS — R252 Cramp and spasm: Secondary | ICD-10-CM | POA: Diagnosis not present

## 2015-10-06 DIAGNOSIS — N183 Chronic kidney disease, stage 3 (moderate): Secondary | ICD-10-CM | POA: Diagnosis not present

## 2015-10-10 ENCOUNTER — Other Ambulatory Visit: Payer: Self-pay | Admitting: Internal Medicine

## 2015-10-10 DIAGNOSIS — N289 Disorder of kidney and ureter, unspecified: Secondary | ICD-10-CM

## 2015-10-14 ENCOUNTER — Ambulatory Visit
Admission: RE | Admit: 2015-10-14 | Discharge: 2015-10-14 | Disposition: A | Discharge status: Home / Self Care | Payer: Medicare Other | Source: Ambulatory Visit | Attending: Internal Medicine | Admitting: Internal Medicine

## 2015-10-14 DIAGNOSIS — N289 Disorder of kidney and ureter, unspecified: Secondary | ICD-10-CM

## 2015-10-14 DIAGNOSIS — K8020 Calculus of gallbladder without cholecystitis without obstruction: Secondary | ICD-10-CM | POA: Diagnosis not present

## 2015-10-17 DIAGNOSIS — M5441 Lumbago with sciatica, right side: Secondary | ICD-10-CM | POA: Diagnosis not present

## 2015-10-17 DIAGNOSIS — M2560 Stiffness of unspecified joint, not elsewhere classified: Secondary | ICD-10-CM | POA: Diagnosis not present

## 2015-10-17 DIAGNOSIS — M6281 Muscle weakness (generalized): Secondary | ICD-10-CM | POA: Diagnosis not present

## 2015-10-17 DIAGNOSIS — M6249 Contracture of muscle, multiple sites: Secondary | ICD-10-CM | POA: Diagnosis not present

## 2015-10-19 DIAGNOSIS — M7542 Impingement syndrome of left shoulder: Secondary | ICD-10-CM | POA: Diagnosis not present

## 2015-10-19 DIAGNOSIS — M2560 Stiffness of unspecified joint, not elsewhere classified: Secondary | ICD-10-CM | POA: Diagnosis not present

## 2015-10-19 DIAGNOSIS — M5441 Lumbago with sciatica, right side: Secondary | ICD-10-CM | POA: Diagnosis not present

## 2015-10-19 DIAGNOSIS — M6281 Muscle weakness (generalized): Secondary | ICD-10-CM | POA: Diagnosis not present

## 2015-10-19 DIAGNOSIS — M6249 Contracture of muscle, multiple sites: Secondary | ICD-10-CM | POA: Diagnosis not present

## 2015-10-21 DIAGNOSIS — M6249 Contracture of muscle, multiple sites: Secondary | ICD-10-CM | POA: Diagnosis not present

## 2015-10-21 DIAGNOSIS — M6281 Muscle weakness (generalized): Secondary | ICD-10-CM | POA: Diagnosis not present

## 2015-10-21 DIAGNOSIS — M2560 Stiffness of unspecified joint, not elsewhere classified: Secondary | ICD-10-CM | POA: Diagnosis not present

## 2015-10-21 DIAGNOSIS — M5441 Lumbago with sciatica, right side: Secondary | ICD-10-CM | POA: Diagnosis not present

## 2015-10-24 DIAGNOSIS — M6281 Muscle weakness (generalized): Secondary | ICD-10-CM | POA: Diagnosis not present

## 2015-10-24 DIAGNOSIS — M2560 Stiffness of unspecified joint, not elsewhere classified: Secondary | ICD-10-CM | POA: Diagnosis not present

## 2015-10-24 DIAGNOSIS — M6249 Contracture of muscle, multiple sites: Secondary | ICD-10-CM | POA: Diagnosis not present

## 2015-10-24 DIAGNOSIS — M5441 Lumbago with sciatica, right side: Secondary | ICD-10-CM | POA: Diagnosis not present

## 2015-10-26 DIAGNOSIS — M6281 Muscle weakness (generalized): Secondary | ICD-10-CM | POA: Diagnosis not present

## 2015-10-26 DIAGNOSIS — M6249 Contracture of muscle, multiple sites: Secondary | ICD-10-CM | POA: Diagnosis not present

## 2015-10-26 DIAGNOSIS — M2560 Stiffness of unspecified joint, not elsewhere classified: Secondary | ICD-10-CM | POA: Diagnosis not present

## 2015-10-26 DIAGNOSIS — M5441 Lumbago with sciatica, right side: Secondary | ICD-10-CM | POA: Diagnosis not present

## 2015-11-07 DIAGNOSIS — M6249 Contracture of muscle, multiple sites: Secondary | ICD-10-CM | POA: Diagnosis not present

## 2015-11-07 DIAGNOSIS — M6281 Muscle weakness (generalized): Secondary | ICD-10-CM | POA: Diagnosis not present

## 2015-11-07 DIAGNOSIS — M5441 Lumbago with sciatica, right side: Secondary | ICD-10-CM | POA: Diagnosis not present

## 2015-11-07 DIAGNOSIS — M2560 Stiffness of unspecified joint, not elsewhere classified: Secondary | ICD-10-CM | POA: Diagnosis not present

## 2015-11-15 DIAGNOSIS — M7061 Trochanteric bursitis, right hip: Secondary | ICD-10-CM | POA: Diagnosis not present

## 2015-11-15 DIAGNOSIS — M7062 Trochanteric bursitis, left hip: Secondary | ICD-10-CM | POA: Diagnosis not present

## 2015-11-23 DIAGNOSIS — G7000 Myasthenia gravis without (acute) exacerbation: Secondary | ICD-10-CM | POA: Diagnosis not present

## 2015-11-23 DIAGNOSIS — Z79899 Other long term (current) drug therapy: Secondary | ICD-10-CM | POA: Diagnosis not present

## 2015-11-23 DIAGNOSIS — Z7952 Long term (current) use of systemic steroids: Secondary | ICD-10-CM | POA: Diagnosis not present

## 2015-11-23 DIAGNOSIS — Z7982 Long term (current) use of aspirin: Secondary | ICD-10-CM | POA: Diagnosis not present

## 2015-12-05 DIAGNOSIS — K8020 Calculus of gallbladder without cholecystitis without obstruction: Secondary | ICD-10-CM | POA: Diagnosis not present

## 2015-12-05 DIAGNOSIS — D1803 Hemangioma of intra-abdominal structures: Secondary | ICD-10-CM | POA: Diagnosis not present

## 2015-12-05 DIAGNOSIS — K2270 Barrett's esophagus without dysplasia: Secondary | ICD-10-CM | POA: Diagnosis not present

## 2015-12-05 DIAGNOSIS — Z1389 Encounter for screening for other disorder: Secondary | ICD-10-CM | POA: Diagnosis not present

## 2015-12-05 DIAGNOSIS — Z6827 Body mass index (BMI) 27.0-27.9, adult: Secondary | ICD-10-CM | POA: Diagnosis not present

## 2015-12-05 DIAGNOSIS — G7000 Myasthenia gravis without (acute) exacerbation: Secondary | ICD-10-CM | POA: Diagnosis not present

## 2015-12-05 DIAGNOSIS — N183 Chronic kidney disease, stage 3 (moderate): Secondary | ICD-10-CM | POA: Diagnosis not present

## 2015-12-05 DIAGNOSIS — D649 Anemia, unspecified: Secondary | ICD-10-CM | POA: Diagnosis not present

## 2015-12-15 DIAGNOSIS — K2270 Barrett's esophagus without dysplasia: Secondary | ICD-10-CM | POA: Diagnosis not present

## 2015-12-15 DIAGNOSIS — N289 Disorder of kidney and ureter, unspecified: Secondary | ICD-10-CM | POA: Diagnosis not present

## 2015-12-15 DIAGNOSIS — G7000 Myasthenia gravis without (acute) exacerbation: Secondary | ICD-10-CM | POA: Diagnosis not present

## 2015-12-15 DIAGNOSIS — K5732 Diverticulitis of large intestine without perforation or abscess without bleeding: Secondary | ICD-10-CM | POA: Diagnosis not present

## 2015-12-15 DIAGNOSIS — K635 Polyp of colon: Secondary | ICD-10-CM | POA: Diagnosis not present

## 2016-01-04 DIAGNOSIS — Z6827 Body mass index (BMI) 27.0-27.9, adult: Secondary | ICD-10-CM | POA: Diagnosis not present

## 2016-01-04 DIAGNOSIS — L814 Other melanin hyperpigmentation: Secondary | ICD-10-CM | POA: Diagnosis not present

## 2016-01-04 DIAGNOSIS — B353 Tinea pedis: Secondary | ICD-10-CM | POA: Diagnosis not present

## 2016-01-04 DIAGNOSIS — L821 Other seborrheic keratosis: Secondary | ICD-10-CM | POA: Diagnosis not present

## 2016-01-04 DIAGNOSIS — L918 Other hypertrophic disorders of the skin: Secondary | ICD-10-CM | POA: Diagnosis not present

## 2016-01-04 DIAGNOSIS — D1801 Hemangioma of skin and subcutaneous tissue: Secondary | ICD-10-CM | POA: Diagnosis not present

## 2016-01-04 DIAGNOSIS — R600 Localized edema: Secondary | ICD-10-CM | POA: Diagnosis not present

## 2016-01-04 DIAGNOSIS — Z85828 Personal history of other malignant neoplasm of skin: Secondary | ICD-10-CM | POA: Diagnosis not present

## 2016-01-04 DIAGNOSIS — D2371 Other benign neoplasm of skin of right lower limb, including hip: Secondary | ICD-10-CM | POA: Diagnosis not present

## 2016-01-04 DIAGNOSIS — B351 Tinea unguium: Secondary | ICD-10-CM | POA: Diagnosis not present

## 2016-01-05 DIAGNOSIS — M7061 Trochanteric bursitis, right hip: Secondary | ICD-10-CM | POA: Diagnosis not present

## 2016-01-05 DIAGNOSIS — M545 Low back pain: Secondary | ICD-10-CM | POA: Diagnosis not present

## 2016-01-26 DIAGNOSIS — E611 Iron deficiency: Secondary | ICD-10-CM | POA: Diagnosis not present

## 2016-01-26 DIAGNOSIS — D472 Monoclonal gammopathy: Secondary | ICD-10-CM | POA: Diagnosis not present

## 2016-02-15 DIAGNOSIS — H524 Presbyopia: Secondary | ICD-10-CM | POA: Diagnosis not present

## 2016-02-15 DIAGNOSIS — M19042 Primary osteoarthritis, left hand: Secondary | ICD-10-CM | POA: Diagnosis not present

## 2016-02-15 DIAGNOSIS — H25813 Combined forms of age-related cataract, bilateral: Secondary | ICD-10-CM | POA: Diagnosis not present

## 2016-02-15 DIAGNOSIS — R600 Localized edema: Secondary | ICD-10-CM | POA: Diagnosis not present

## 2016-02-15 DIAGNOSIS — Z6827 Body mass index (BMI) 27.0-27.9, adult: Secondary | ICD-10-CM | POA: Diagnosis not present

## 2016-02-15 DIAGNOSIS — H52223 Regular astigmatism, bilateral: Secondary | ICD-10-CM | POA: Diagnosis not present

## 2016-02-15 DIAGNOSIS — H1030 Unspecified acute conjunctivitis, unspecified eye: Secondary | ICD-10-CM | POA: Diagnosis not present

## 2016-02-23 DIAGNOSIS — H04123 Dry eye syndrome of bilateral lacrimal glands: Secondary | ICD-10-CM | POA: Diagnosis not present

## 2016-02-24 DIAGNOSIS — M25442 Effusion, left hand: Secondary | ICD-10-CM | POA: Diagnosis not present

## 2016-03-14 DIAGNOSIS — M25442 Effusion, left hand: Secondary | ICD-10-CM | POA: Diagnosis not present

## 2016-04-06 ENCOUNTER — Other Ambulatory Visit: Payer: Self-pay | Admitting: Internal Medicine

## 2016-04-06 DIAGNOSIS — K769 Liver disease, unspecified: Secondary | ICD-10-CM

## 2016-04-10 DIAGNOSIS — Z125 Encounter for screening for malignant neoplasm of prostate: Secondary | ICD-10-CM | POA: Diagnosis not present

## 2016-04-10 DIAGNOSIS — N183 Chronic kidney disease, stage 3 (moderate): Secondary | ICD-10-CM | POA: Diagnosis not present

## 2016-04-10 DIAGNOSIS — Z0000 Encounter for general adult medical examination without abnormal findings: Secondary | ICD-10-CM | POA: Diagnosis not present

## 2016-04-10 DIAGNOSIS — K7689 Other specified diseases of liver: Secondary | ICD-10-CM | POA: Diagnosis not present

## 2016-04-12 ENCOUNTER — Ambulatory Visit
Admission: RE | Admit: 2016-04-12 | Discharge: 2016-04-12 | Disposition: A | Discharge status: Home / Self Care | Payer: Medicare Other | Source: Ambulatory Visit | Attending: Internal Medicine | Admitting: Internal Medicine

## 2016-04-12 DIAGNOSIS — D1803 Hemangioma of intra-abdominal structures: Secondary | ICD-10-CM | POA: Diagnosis not present

## 2016-04-12 DIAGNOSIS — K769 Liver disease, unspecified: Secondary | ICD-10-CM

## 2016-04-16 DIAGNOSIS — R600 Localized edema: Secondary | ICD-10-CM | POA: Diagnosis not present

## 2016-04-16 DIAGNOSIS — Z1212 Encounter for screening for malignant neoplasm of rectum: Secondary | ICD-10-CM | POA: Diagnosis not present

## 2016-04-16 DIAGNOSIS — R972 Elevated prostate specific antigen [PSA]: Secondary | ICD-10-CM | POA: Diagnosis not present

## 2016-04-16 DIAGNOSIS — N183 Chronic kidney disease, stage 3 (moderate): Secondary | ICD-10-CM | POA: Diagnosis not present

## 2016-04-16 DIAGNOSIS — Z1389 Encounter for screening for other disorder: Secondary | ICD-10-CM | POA: Diagnosis not present

## 2016-04-16 DIAGNOSIS — K769 Liver disease, unspecified: Secondary | ICD-10-CM | POA: Diagnosis not present

## 2016-04-16 DIAGNOSIS — H1130 Conjunctival hemorrhage, unspecified eye: Secondary | ICD-10-CM | POA: Diagnosis not present

## 2016-04-16 DIAGNOSIS — Z23 Encounter for immunization: Secondary | ICD-10-CM | POA: Diagnosis not present

## 2016-04-16 DIAGNOSIS — D649 Anemia, unspecified: Secondary | ICD-10-CM | POA: Diagnosis not present

## 2016-04-16 DIAGNOSIS — Z0000 Encounter for general adult medical examination without abnormal findings: Secondary | ICD-10-CM | POA: Diagnosis not present

## 2016-04-16 DIAGNOSIS — K2270 Barrett's esophagus without dysplasia: Secondary | ICD-10-CM | POA: Diagnosis not present

## 2016-04-16 DIAGNOSIS — K8020 Calculus of gallbladder without cholecystitis without obstruction: Secondary | ICD-10-CM | POA: Diagnosis not present

## 2016-04-16 DIAGNOSIS — G7000 Myasthenia gravis without (acute) exacerbation: Secondary | ICD-10-CM | POA: Diagnosis not present

## 2016-05-03 DIAGNOSIS — H5211 Myopia, right eye: Secondary | ICD-10-CM | POA: Diagnosis not present

## 2016-05-03 DIAGNOSIS — H25013 Cortical age-related cataract, bilateral: Secondary | ICD-10-CM | POA: Diagnosis not present

## 2016-05-03 DIAGNOSIS — H2513 Age-related nuclear cataract, bilateral: Secondary | ICD-10-CM | POA: Diagnosis not present

## 2016-05-03 DIAGNOSIS — H04123 Dry eye syndrome of bilateral lacrimal glands: Secondary | ICD-10-CM | POA: Diagnosis not present

## 2016-05-10 DIAGNOSIS — M7541 Impingement syndrome of right shoulder: Secondary | ICD-10-CM | POA: Diagnosis not present

## 2016-05-10 DIAGNOSIS — M7542 Impingement syndrome of left shoulder: Secondary | ICD-10-CM | POA: Diagnosis not present

## 2016-05-18 DIAGNOSIS — R1310 Dysphagia, unspecified: Secondary | ICD-10-CM | POA: Diagnosis not present

## 2016-05-18 DIAGNOSIS — Z888 Allergy status to other drugs, medicaments and biological substances status: Secondary | ICD-10-CM | POA: Diagnosis not present

## 2016-05-18 DIAGNOSIS — Z8719 Personal history of other diseases of the digestive system: Secondary | ICD-10-CM | POA: Diagnosis not present

## 2016-05-18 DIAGNOSIS — Z9049 Acquired absence of other specified parts of digestive tract: Secondary | ICD-10-CM | POA: Diagnosis not present

## 2016-05-18 DIAGNOSIS — K22719 Barrett's esophagus with dysplasia, unspecified: Secondary | ICD-10-CM | POA: Diagnosis not present

## 2016-05-18 DIAGNOSIS — K219 Gastro-esophageal reflux disease without esophagitis: Secondary | ICD-10-CM | POA: Diagnosis not present

## 2016-05-18 DIAGNOSIS — K208 Other esophagitis: Secondary | ICD-10-CM | POA: Diagnosis not present

## 2016-05-18 DIAGNOSIS — K449 Diaphragmatic hernia without obstruction or gangrene: Secondary | ICD-10-CM | POA: Diagnosis not present

## 2016-05-18 DIAGNOSIS — K2270 Barrett's esophagus without dysplasia: Secondary | ICD-10-CM | POA: Diagnosis not present

## 2016-05-18 DIAGNOSIS — K3189 Other diseases of stomach and duodenum: Secondary | ICD-10-CM | POA: Diagnosis not present

## 2016-05-28 DIAGNOSIS — Z79899 Other long term (current) drug therapy: Secondary | ICD-10-CM | POA: Diagnosis not present

## 2016-05-28 DIAGNOSIS — Z7952 Long term (current) use of systemic steroids: Secondary | ICD-10-CM | POA: Diagnosis not present

## 2016-05-28 DIAGNOSIS — K2270 Barrett's esophagus without dysplasia: Secondary | ICD-10-CM | POA: Diagnosis not present

## 2016-05-28 DIAGNOSIS — Z7982 Long term (current) use of aspirin: Secondary | ICD-10-CM | POA: Diagnosis not present

## 2016-05-28 DIAGNOSIS — G7000 Myasthenia gravis without (acute) exacerbation: Secondary | ICD-10-CM | POA: Diagnosis not present

## 2016-07-11 DIAGNOSIS — L814 Other melanin hyperpigmentation: Secondary | ICD-10-CM | POA: Diagnosis not present

## 2016-07-11 DIAGNOSIS — B351 Tinea unguium: Secondary | ICD-10-CM | POA: Diagnosis not present

## 2016-07-11 DIAGNOSIS — D225 Melanocytic nevi of trunk: Secondary | ICD-10-CM | POA: Diagnosis not present

## 2016-07-11 DIAGNOSIS — D1801 Hemangioma of skin and subcutaneous tissue: Secondary | ICD-10-CM | POA: Diagnosis not present

## 2016-07-11 DIAGNOSIS — L720 Epidermal cyst: Secondary | ICD-10-CM | POA: Diagnosis not present

## 2016-07-11 DIAGNOSIS — L821 Other seborrheic keratosis: Secondary | ICD-10-CM | POA: Diagnosis not present

## 2016-09-09 IMAGING — CR DG CHEST 2V
2 series · 2 of 2 positions shown · non-contrast
Comparison: 08/23/2014

CLINICAL DATA: Preop.  Lumbar spinal stenosis.

EXAM:
CHEST  2 VIEW

[w chest pa]
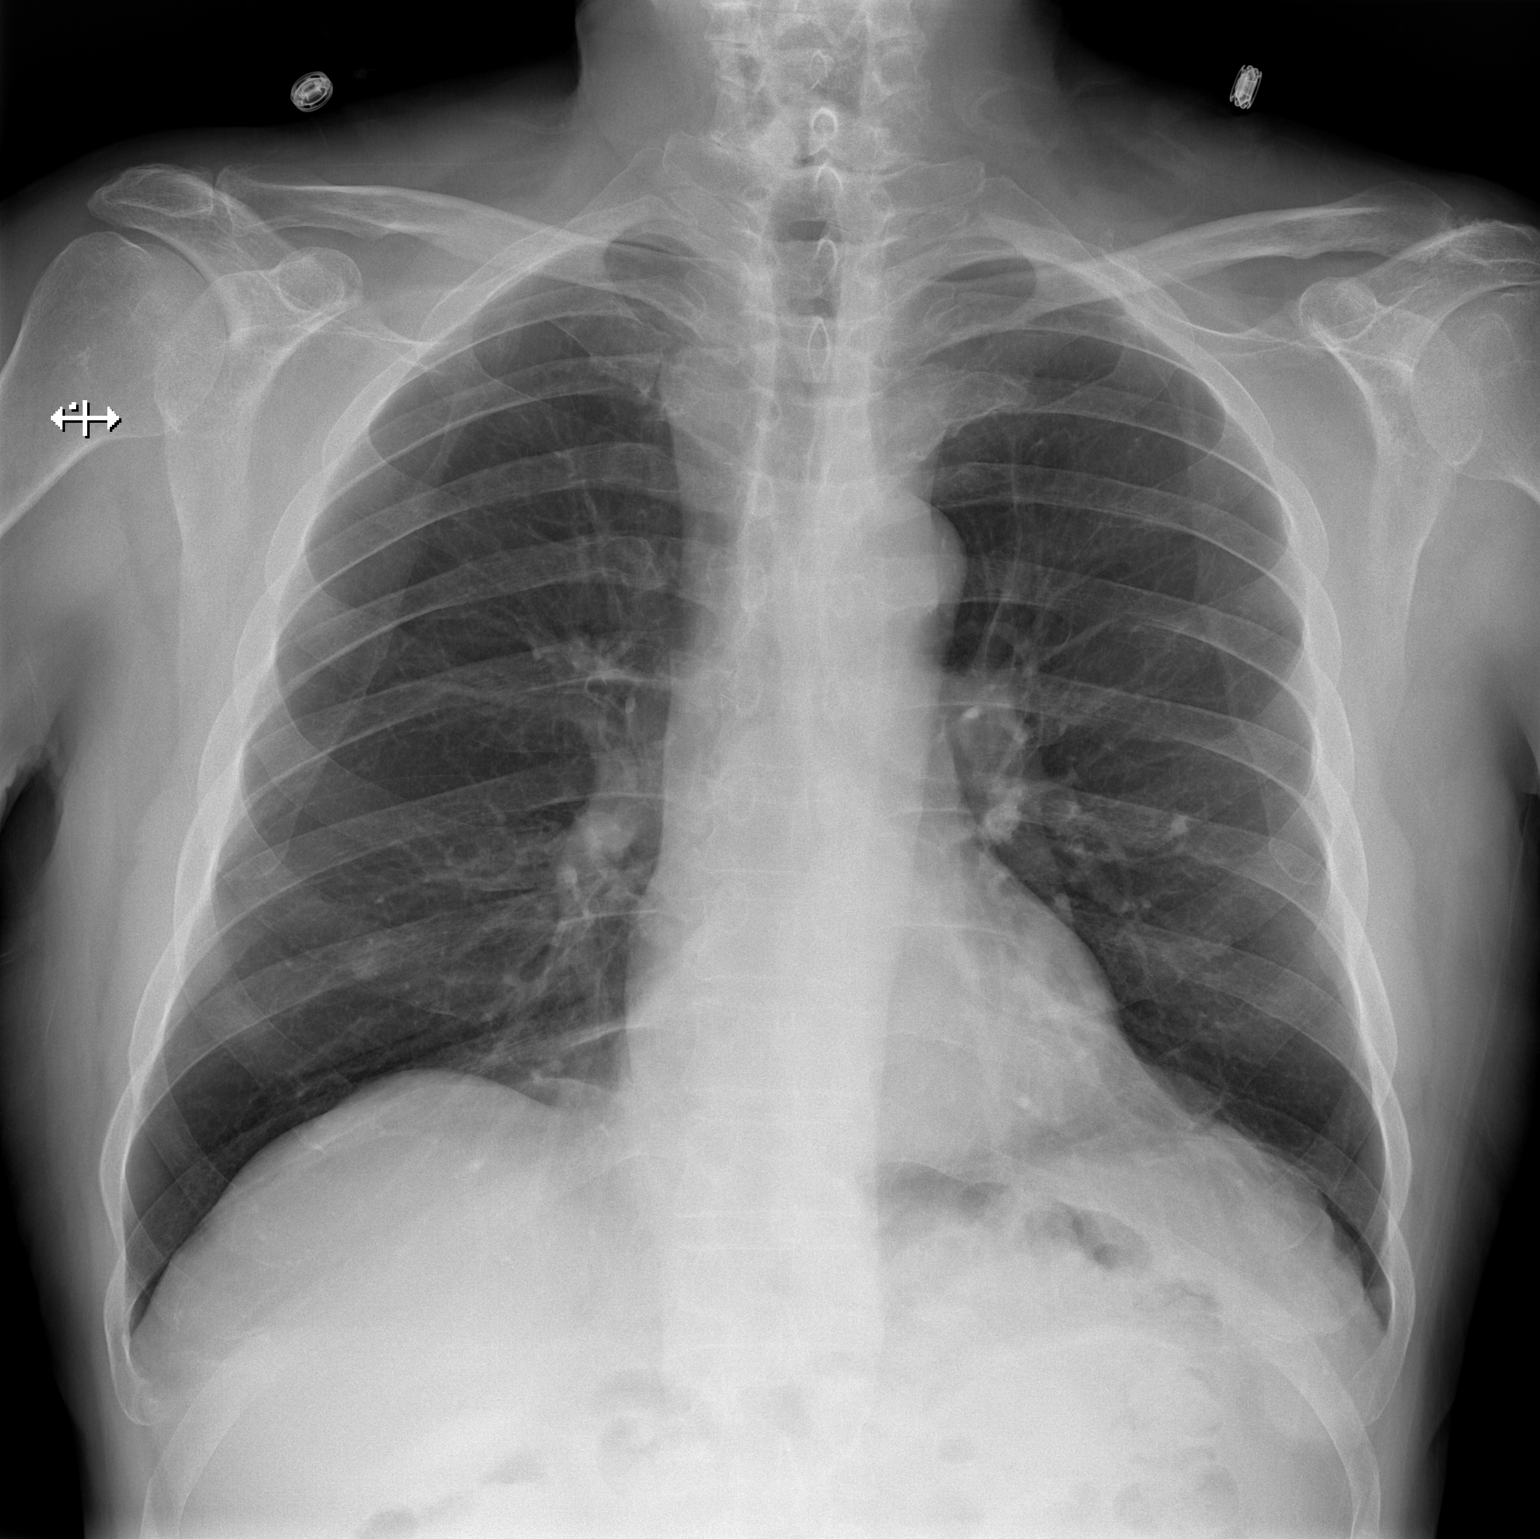

[w chest lat]
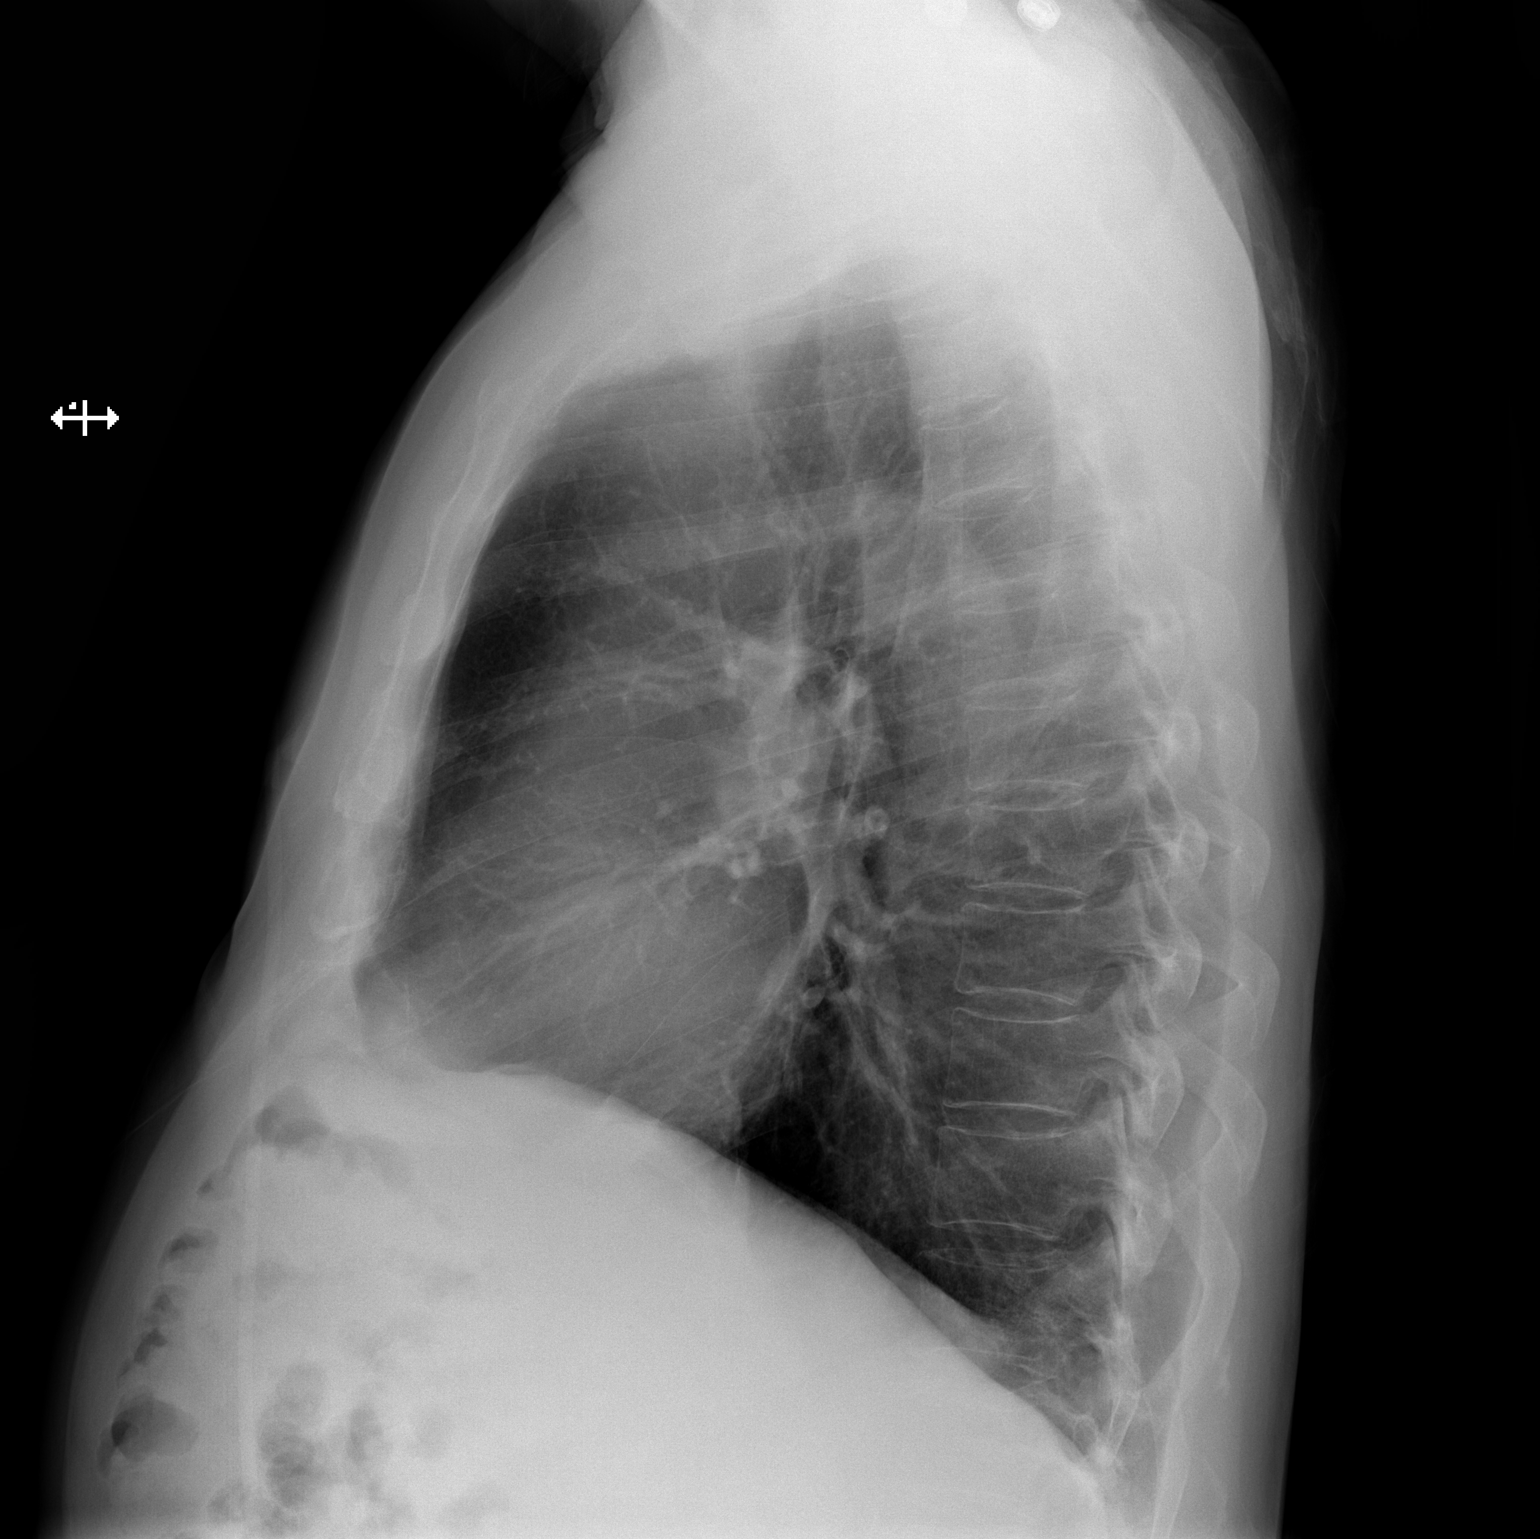

[2 of 2 positions shown; findings below may reference images not displayed]

FINDINGS: The cardiomediastinal silhouette is within normal limits. The lungs
are well inflated without evidence of airspace consolidation, edema,
pleural effusion, or pneumothorax. Two small nodular densities
project over both lower lungs on the PA image, measuring 7-8 mm in
diameter and not clearly present on prior studies though favored to
reflect nipple shadows. No acute osseous abnormality is seen.
IMPRESSION: 1. No evidence of active cardiopulmonary disease.
2. Small densities projecting over both lung bases favored to
reflect nipple shadows rather than lung nodules. Repeat PA
radiograph with nipple markers could be used to confirm.

## 2016-10-10 DIAGNOSIS — R972 Elevated prostate specific antigen [PSA]: Secondary | ICD-10-CM | POA: Diagnosis not present

## 2016-10-15 DIAGNOSIS — M1990 Unspecified osteoarthritis, unspecified site: Secondary | ICD-10-CM | POA: Diagnosis not present

## 2016-10-15 DIAGNOSIS — Z1389 Encounter for screening for other disorder: Secondary | ICD-10-CM | POA: Diagnosis not present

## 2016-10-15 DIAGNOSIS — G4709 Other insomnia: Secondary | ICD-10-CM | POA: Diagnosis not present

## 2016-10-15 DIAGNOSIS — K769 Liver disease, unspecified: Secondary | ICD-10-CM | POA: Diagnosis not present

## 2016-10-15 DIAGNOSIS — K2270 Barrett's esophagus without dysplasia: Secondary | ICD-10-CM | POA: Diagnosis not present

## 2016-10-15 DIAGNOSIS — R1084 Generalized abdominal pain: Secondary | ICD-10-CM | POA: Diagnosis not present

## 2016-10-15 DIAGNOSIS — Z6826 Body mass index (BMI) 26.0-26.9, adult: Secondary | ICD-10-CM | POA: Diagnosis not present

## 2016-10-15 DIAGNOSIS — M25551 Pain in right hip: Secondary | ICD-10-CM | POA: Diagnosis not present

## 2016-10-15 DIAGNOSIS — R972 Elevated prostate specific antigen [PSA]: Secondary | ICD-10-CM | POA: Diagnosis not present

## 2016-10-15 DIAGNOSIS — N183 Chronic kidney disease, stage 3 (moderate): Secondary | ICD-10-CM | POA: Diagnosis not present

## 2016-10-15 DIAGNOSIS — M25552 Pain in left hip: Secondary | ICD-10-CM | POA: Diagnosis not present

## 2016-10-25 DIAGNOSIS — N281 Cyst of kidney, acquired: Secondary | ICD-10-CM | POA: Diagnosis not present

## 2016-10-25 DIAGNOSIS — R972 Elevated prostate specific antigen [PSA]: Secondary | ICD-10-CM | POA: Diagnosis not present

## 2016-10-25 DIAGNOSIS — N400 Enlarged prostate without lower urinary tract symptoms: Secondary | ICD-10-CM | POA: Diagnosis not present

## 2016-11-05 IMAGING — US US ABDOMEN COMPLETE
1 series · 13 of 25 positions shown · non-contrast
Comparison: None.

CLINICAL DATA: Abnormal liver function tests and renal
insufficiency.

EXAM:
ABDOMEN ULTRASOUND COMPLETE

[Series 1: us abdomen complete · 0.32mm/px · 13 of 83 slices shown]
[im 1/83]
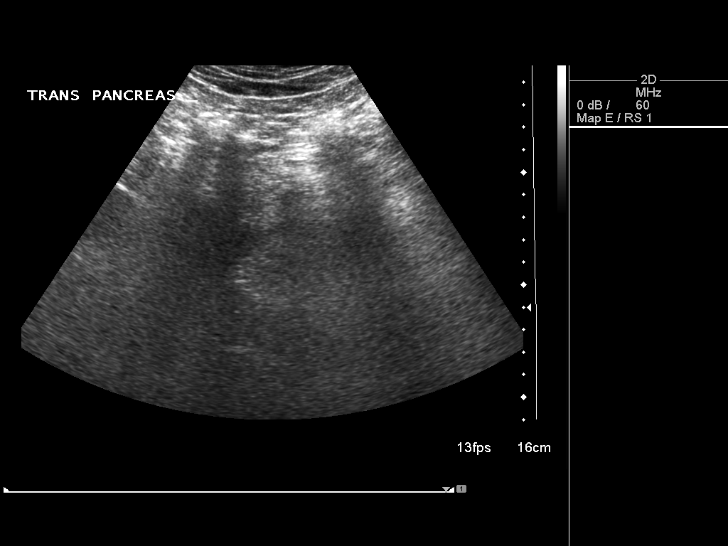
[im 7/83]
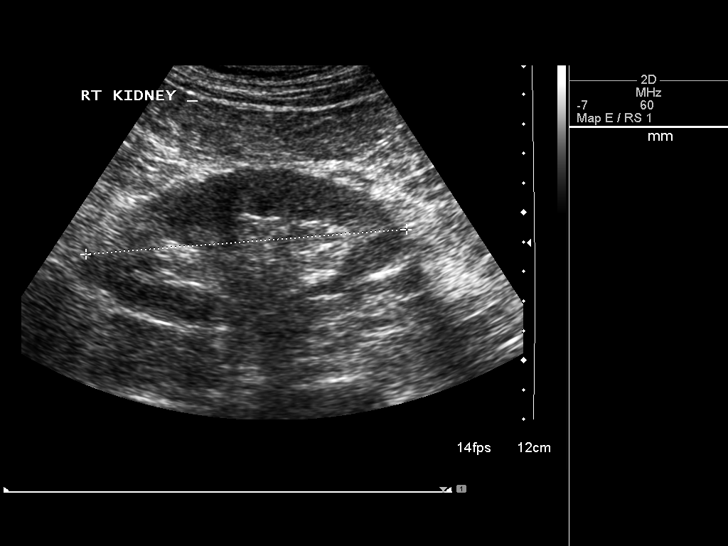
[im 14/83]
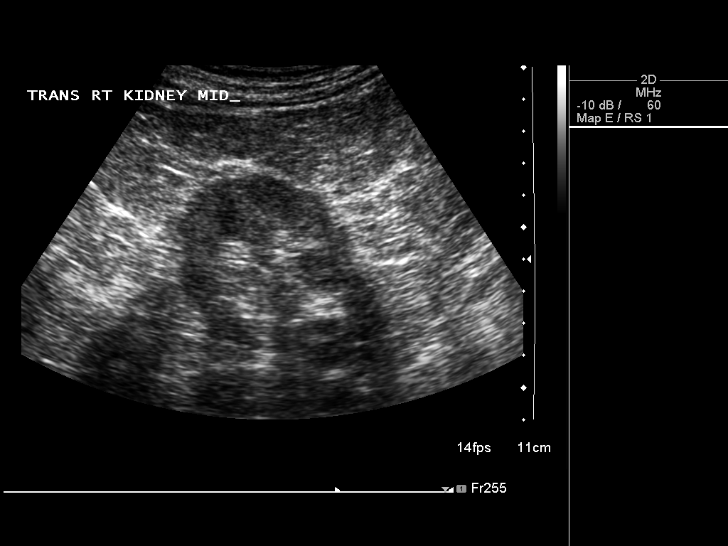
[im 21/83]
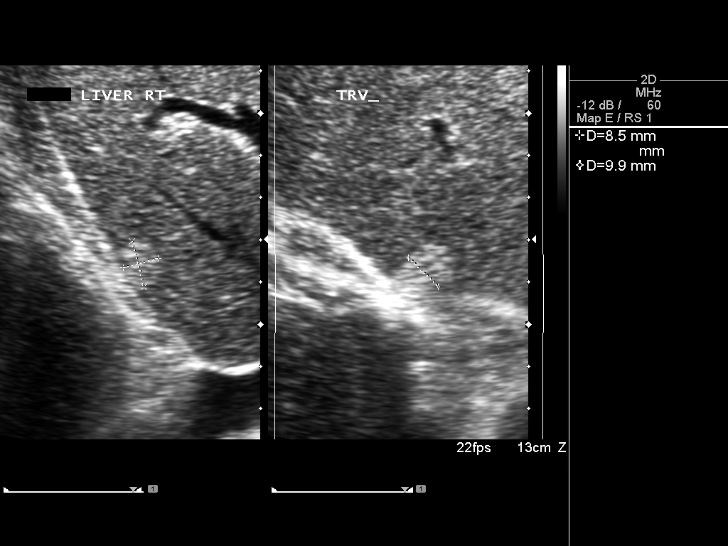
[im 28/83]
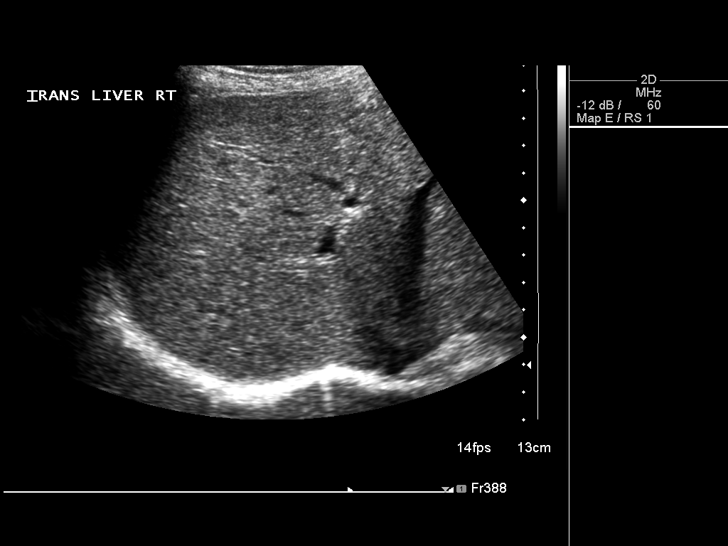
[im 35/83]
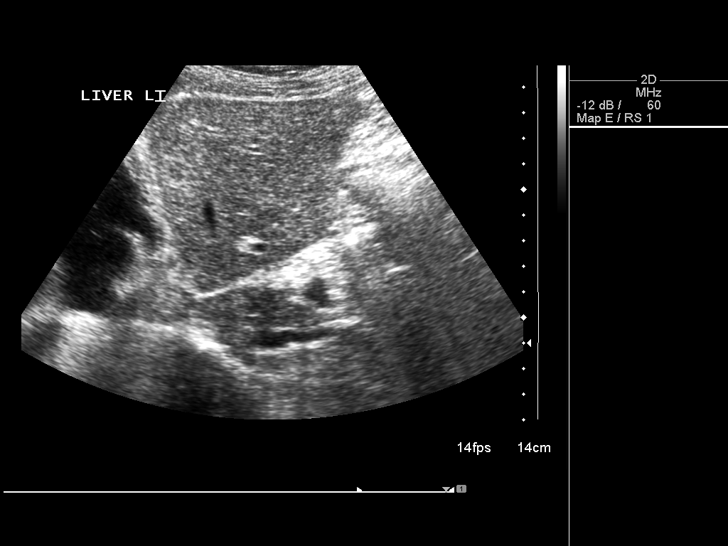
[im 42/83]
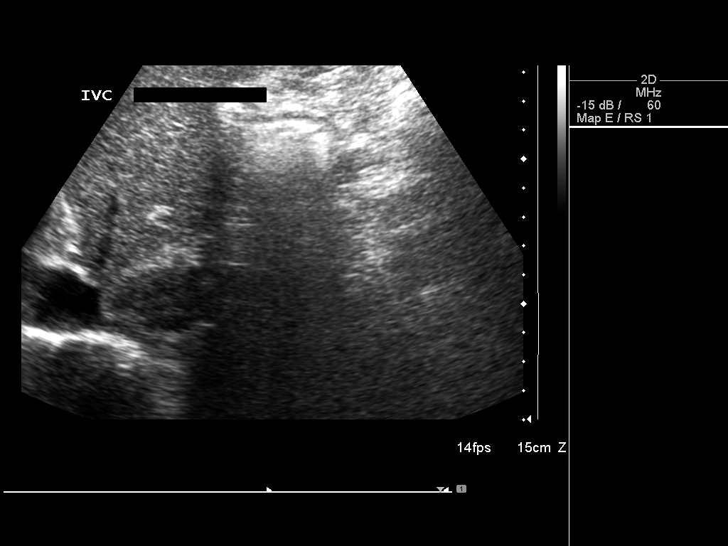
[im 48/83]
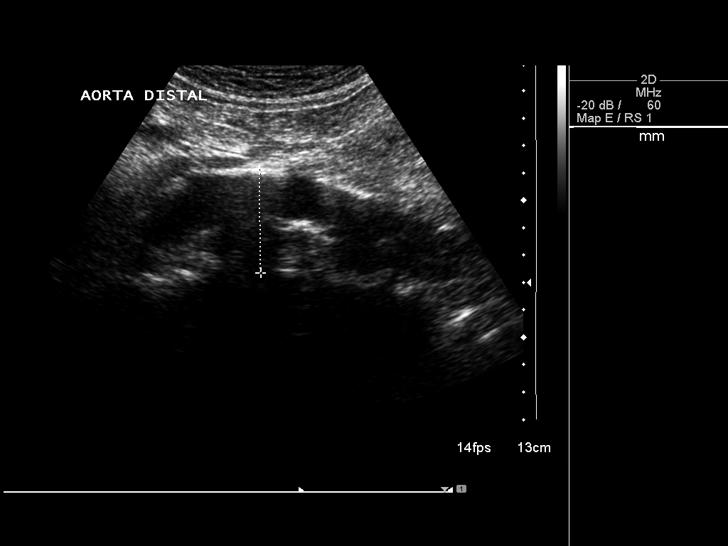
[im 55/83]
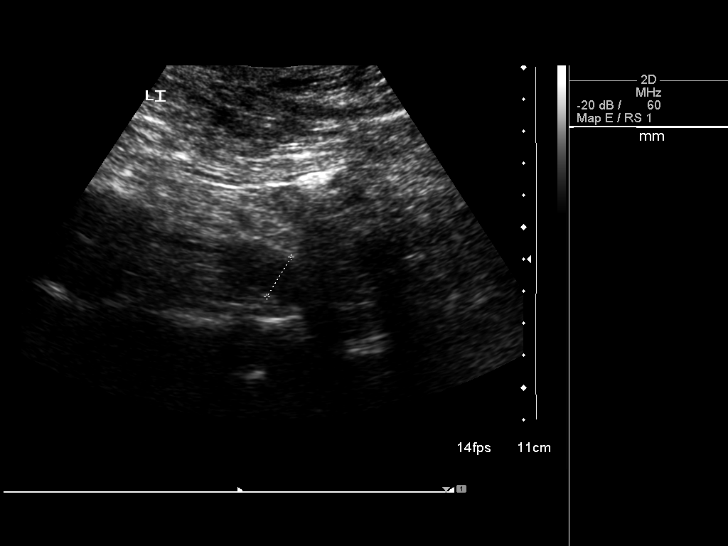
[im 62/83]
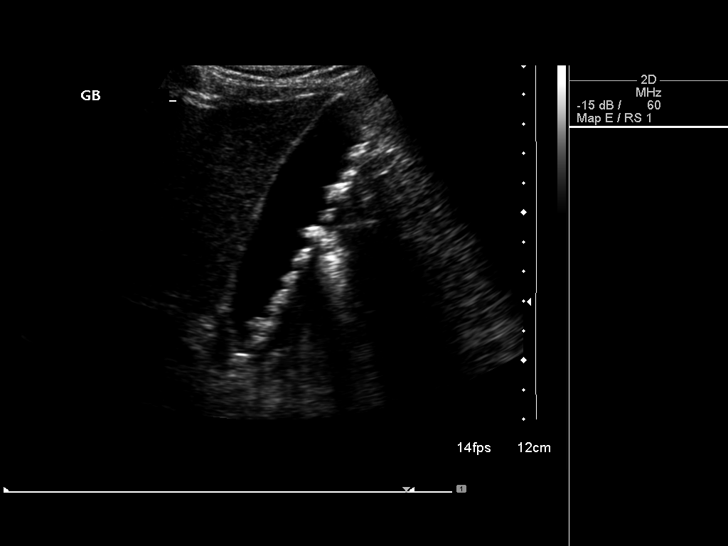
[im 69/83]
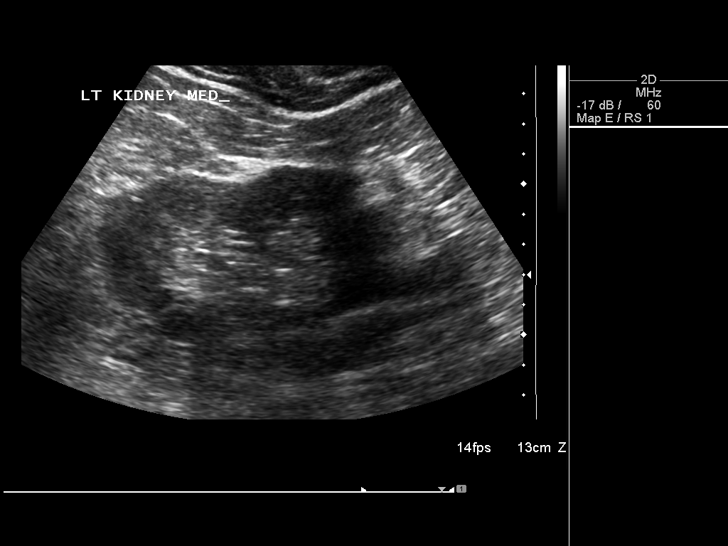
[im 76/83]
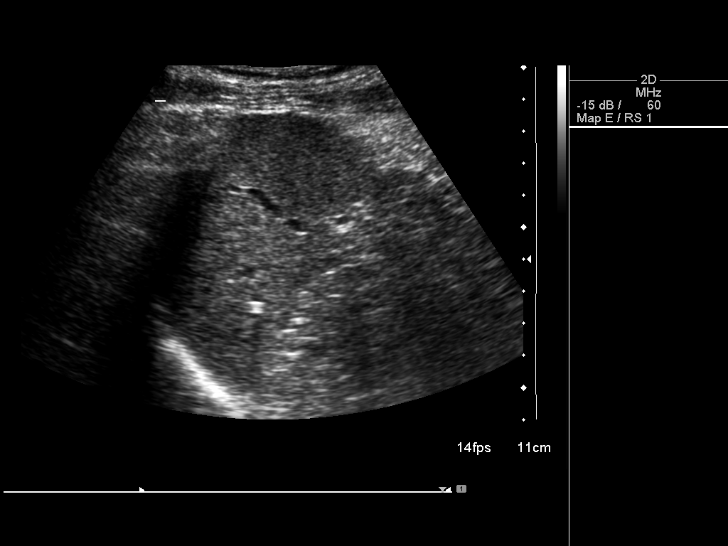
[im 83/83]
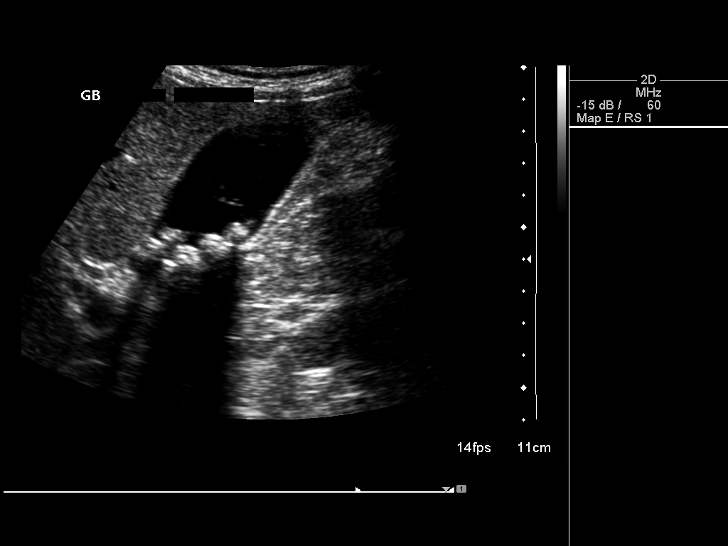

[13 of 25 positions shown; findings below may reference images not displayed]

FINDINGS: Gallbladder: Multiple shadowing and mal bile gallstones are
identified in the gallbladder. No associated gallbladder wall
thickening or sonographic Murphy's sign.

Common bile duct: Diameter: Normal caliber 3 mm.

Liver: At the posterior dome of the right lobe of the liver, a
discrete subcapsular rounded echogenic lesion is identified
measuring approximately 0.9 x 1.1 x 1.0 cm. This most likely
represents a hemangioma. No evidence of steatosis or cirrhosis. No
biliary ductal dilatation identified in the liver.

IVC: No abnormality visualized.

Pancreas: Visualized portion unremarkable.

Spleen: The spleen is normal in size. Several small echogenic foci
show posterior acoustic shadowing and are consistent with
granulomas.

Right Kidney: Length: 10.9 cm. Echogenicity within normal limits. No
mass or hydronephrosis visualized.

Left Kidney: Length: 11.1 cm. Cyst of the left kidney measures
cm and has a benign appearance. No hydronephrosis.

Abdominal aorta: No aneurysm visualized.

Other findings: None.
IMPRESSION: 1. Cholelithiasis with multiple gallstones identified. No evidence
of cholecystitis or biliary obstruction by ultrasound.
2. Discrete 1 cm hyperechoic lesion in the posterior dome of the
liver most likely representing hemangioma. Given lack of prior
imaging of this abnormality, ultrasound follow-up is recommended in
6 months. If the patient has a history of malignancy, MRI of the
abdomen is recommended.
3. No evidence of steatosis or cirrhosis by ultrasound.
4. Unremarkable sonographic appearance of the kidneys.
5. Evidence of prior granulomatous disease with several small
granulomata identified in the spleen.

## 2016-11-26 DIAGNOSIS — Z7952 Long term (current) use of systemic steroids: Secondary | ICD-10-CM | POA: Diagnosis not present

## 2016-11-26 DIAGNOSIS — Z79899 Other long term (current) drug therapy: Secondary | ICD-10-CM | POA: Diagnosis not present

## 2016-11-26 DIAGNOSIS — G7000 Myasthenia gravis without (acute) exacerbation: Secondary | ICD-10-CM | POA: Diagnosis not present

## 2016-11-30 DIAGNOSIS — K5730 Diverticulosis of large intestine without perforation or abscess without bleeding: Secondary | ICD-10-CM | POA: Diagnosis not present

## 2016-11-30 DIAGNOSIS — Z86010 Personal history of colonic polyps: Secondary | ICD-10-CM | POA: Diagnosis not present

## 2016-11-30 DIAGNOSIS — Z1211 Encounter for screening for malignant neoplasm of colon: Secondary | ICD-10-CM | POA: Diagnosis not present

## 2016-11-30 DIAGNOSIS — K5790 Diverticulosis of intestine, part unspecified, without perforation or abscess without bleeding: Secondary | ICD-10-CM | POA: Diagnosis not present

## 2016-11-30 DIAGNOSIS — K6389 Other specified diseases of intestine: Secondary | ICD-10-CM | POA: Diagnosis not present

## 2016-12-05 DIAGNOSIS — B353 Tinea pedis: Secondary | ICD-10-CM | POA: Diagnosis not present

## 2016-12-05 DIAGNOSIS — Z85828 Personal history of other malignant neoplasm of skin: Secondary | ICD-10-CM | POA: Diagnosis not present

## 2016-12-05 DIAGNOSIS — L720 Epidermal cyst: Secondary | ICD-10-CM | POA: Diagnosis not present

## 2016-12-05 DIAGNOSIS — L905 Scar conditions and fibrosis of skin: Secondary | ICD-10-CM | POA: Diagnosis not present

## 2016-12-05 DIAGNOSIS — B351 Tinea unguium: Secondary | ICD-10-CM | POA: Diagnosis not present

## 2016-12-05 DIAGNOSIS — L84 Corns and callosities: Secondary | ICD-10-CM | POA: Diagnosis not present

## 2016-12-05 DIAGNOSIS — L821 Other seborrheic keratosis: Secondary | ICD-10-CM | POA: Diagnosis not present

## 2017-01-23 DIAGNOSIS — R972 Elevated prostate specific antigen [PSA]: Secondary | ICD-10-CM | POA: Diagnosis not present

## 2017-01-24 DIAGNOSIS — D472 Monoclonal gammopathy: Secondary | ICD-10-CM | POA: Diagnosis not present

## 2017-01-24 DIAGNOSIS — E611 Iron deficiency: Secondary | ICD-10-CM | POA: Diagnosis not present

## 2017-01-24 DIAGNOSIS — R238 Other skin changes: Secondary | ICD-10-CM | POA: Diagnosis not present

## 2017-03-27 ENCOUNTER — Other Ambulatory Visit: Payer: Self-pay | Admitting: Internal Medicine

## 2017-03-27 DIAGNOSIS — R160 Hepatomegaly, not elsewhere classified: Secondary | ICD-10-CM

## 2017-04-15 ENCOUNTER — Ambulatory Visit
Admission: RE | Admit: 2017-04-15 | Discharge: 2017-04-15 | Disposition: A | Discharge status: Home / Self Care | Payer: Medicare Other | Source: Ambulatory Visit | Attending: Internal Medicine | Admitting: Internal Medicine

## 2017-04-15 DIAGNOSIS — R160 Hepatomegaly, not elsewhere classified: Secondary | ICD-10-CM

## 2017-04-15 DIAGNOSIS — K8020 Calculus of gallbladder without cholecystitis without obstruction: Secondary | ICD-10-CM | POA: Diagnosis not present

## 2017-04-17 DIAGNOSIS — Z125 Encounter for screening for malignant neoplasm of prostate: Secondary | ICD-10-CM | POA: Diagnosis not present

## 2017-04-17 DIAGNOSIS — N183 Chronic kidney disease, stage 3 (moderate): Secondary | ICD-10-CM | POA: Diagnosis not present

## 2017-04-17 DIAGNOSIS — Z79899 Other long term (current) drug therapy: Secondary | ICD-10-CM | POA: Diagnosis not present

## 2017-04-24 DIAGNOSIS — Z1389 Encounter for screening for other disorder: Secondary | ICD-10-CM | POA: Diagnosis not present

## 2017-04-24 DIAGNOSIS — Z0000 Encounter for general adult medical examination without abnormal findings: Secondary | ICD-10-CM | POA: Diagnosis not present

## 2017-04-24 DIAGNOSIS — K8020 Calculus of gallbladder without cholecystitis without obstruction: Secondary | ICD-10-CM | POA: Diagnosis not present

## 2017-04-24 DIAGNOSIS — R972 Elevated prostate specific antigen [PSA]: Secondary | ICD-10-CM | POA: Diagnosis not present

## 2017-04-24 DIAGNOSIS — M7661 Achilles tendinitis, right leg: Secondary | ICD-10-CM | POA: Diagnosis not present

## 2017-04-24 DIAGNOSIS — N183 Chronic kidney disease, stage 3 (moderate): Secondary | ICD-10-CM | POA: Diagnosis not present

## 2017-04-24 DIAGNOSIS — Z6826 Body mass index (BMI) 26.0-26.9, adult: Secondary | ICD-10-CM | POA: Diagnosis not present

## 2017-04-24 DIAGNOSIS — R600 Localized edema: Secondary | ICD-10-CM | POA: Diagnosis not present

## 2017-04-24 DIAGNOSIS — N400 Enlarged prostate without lower urinary tract symptoms: Secondary | ICD-10-CM | POA: Diagnosis not present

## 2017-04-24 DIAGNOSIS — H6123 Impacted cerumen, bilateral: Secondary | ICD-10-CM | POA: Diagnosis not present

## 2017-04-24 DIAGNOSIS — Z23 Encounter for immunization: Secondary | ICD-10-CM | POA: Diagnosis not present

## 2017-04-24 DIAGNOSIS — K7689 Other specified diseases of liver: Secondary | ICD-10-CM | POA: Diagnosis not present

## 2017-04-24 DIAGNOSIS — G7000 Myasthenia gravis without (acute) exacerbation: Secondary | ICD-10-CM | POA: Diagnosis not present

## 2017-05-01 DIAGNOSIS — R972 Elevated prostate specific antigen [PSA]: Secondary | ICD-10-CM | POA: Diagnosis not present

## 2017-05-01 DIAGNOSIS — Z1212 Encounter for screening for malignant neoplasm of rectum: Secondary | ICD-10-CM | POA: Diagnosis not present

## 2017-05-01 DIAGNOSIS — N400 Enlarged prostate without lower urinary tract symptoms: Secondary | ICD-10-CM | POA: Diagnosis not present

## 2017-05-06 DIAGNOSIS — H6123 Impacted cerumen, bilateral: Secondary | ICD-10-CM | POA: Diagnosis not present

## 2017-05-06 DIAGNOSIS — Z7289 Other problems related to lifestyle: Secondary | ICD-10-CM | POA: Diagnosis not present

## 2017-05-06 DIAGNOSIS — K08409 Partial loss of teeth, unspecified cause, unspecified class: Secondary | ICD-10-CM | POA: Diagnosis not present

## 2017-05-06 DIAGNOSIS — H93293 Other abnormal auditory perceptions, bilateral: Secondary | ICD-10-CM | POA: Diagnosis not present

## 2017-05-09 DIAGNOSIS — H5213 Myopia, bilateral: Secondary | ICD-10-CM | POA: Diagnosis not present

## 2017-05-09 DIAGNOSIS — H903 Sensorineural hearing loss, bilateral: Secondary | ICD-10-CM | POA: Diagnosis not present

## 2017-05-09 DIAGNOSIS — H04123 Dry eye syndrome of bilateral lacrimal glands: Secondary | ICD-10-CM | POA: Diagnosis not present

## 2017-05-09 DIAGNOSIS — H2513 Age-related nuclear cataract, bilateral: Secondary | ICD-10-CM | POA: Diagnosis not present

## 2017-05-09 DIAGNOSIS — H25012 Cortical age-related cataract, left eye: Secondary | ICD-10-CM | POA: Diagnosis not present

## 2017-05-24 DIAGNOSIS — M7661 Achilles tendinitis, right leg: Secondary | ICD-10-CM | POA: Diagnosis not present

## 2017-05-27 DIAGNOSIS — M19041 Primary osteoarthritis, right hand: Secondary | ICD-10-CM | POA: Diagnosis not present

## 2017-05-27 DIAGNOSIS — M19042 Primary osteoarthritis, left hand: Secondary | ICD-10-CM | POA: Diagnosis not present

## 2017-05-29 DIAGNOSIS — Z888 Allergy status to other drugs, medicaments and biological substances status: Secondary | ICD-10-CM | POA: Diagnosis not present

## 2017-05-29 DIAGNOSIS — G7000 Myasthenia gravis without (acute) exacerbation: Secondary | ICD-10-CM | POA: Diagnosis not present

## 2017-05-29 DIAGNOSIS — Z7952 Long term (current) use of systemic steroids: Secondary | ICD-10-CM | POA: Diagnosis not present

## 2017-05-29 DIAGNOSIS — Z79899 Other long term (current) drug therapy: Secondary | ICD-10-CM | POA: Diagnosis not present

## 2017-05-29 DIAGNOSIS — Z880 Allergy status to penicillin: Secondary | ICD-10-CM | POA: Diagnosis not present

## 2017-06-04 DIAGNOSIS — M7661 Achilles tendinitis, right leg: Secondary | ICD-10-CM | POA: Diagnosis not present

## 2017-06-04 DIAGNOSIS — Z6826 Body mass index (BMI) 26.0-26.9, adult: Secondary | ICD-10-CM | POA: Diagnosis not present

## 2017-06-04 DIAGNOSIS — J181 Lobar pneumonia, unspecified organism: Secondary | ICD-10-CM | POA: Diagnosis not present

## 2017-06-04 DIAGNOSIS — R0789 Other chest pain: Secondary | ICD-10-CM | POA: Diagnosis not present

## 2017-06-18 DIAGNOSIS — J181 Lobar pneumonia, unspecified organism: Secondary | ICD-10-CM | POA: Diagnosis not present

## 2017-06-26 DIAGNOSIS — L821 Other seborrheic keratosis: Secondary | ICD-10-CM | POA: Diagnosis not present

## 2017-06-26 DIAGNOSIS — L905 Scar conditions and fibrosis of skin: Secondary | ICD-10-CM | POA: Diagnosis not present

## 2017-06-26 DIAGNOSIS — L720 Epidermal cyst: Secondary | ICD-10-CM | POA: Diagnosis not present

## 2017-06-26 DIAGNOSIS — L814 Other melanin hyperpigmentation: Secondary | ICD-10-CM | POA: Diagnosis not present

## 2017-06-26 DIAGNOSIS — D692 Other nonthrombocytopenic purpura: Secondary | ICD-10-CM | POA: Diagnosis not present

## 2017-06-26 DIAGNOSIS — Z85828 Personal history of other malignant neoplasm of skin: Secondary | ICD-10-CM | POA: Diagnosis not present

## 2017-06-26 DIAGNOSIS — D1801 Hemangioma of skin and subcutaneous tissue: Secondary | ICD-10-CM | POA: Diagnosis not present

## 2017-06-26 DIAGNOSIS — D2372 Other benign neoplasm of skin of left lower limb, including hip: Secondary | ICD-10-CM | POA: Diagnosis not present

## 2017-08-05 DIAGNOSIS — M5412 Radiculopathy, cervical region: Secondary | ICD-10-CM | POA: Diagnosis not present

## 2017-08-06 ENCOUNTER — Other Ambulatory Visit: Payer: Self-pay | Admitting: Student

## 2017-08-06 DIAGNOSIS — M5412 Radiculopathy, cervical region: Secondary | ICD-10-CM

## 2017-08-10 ENCOUNTER — Ambulatory Visit
Admission: RE | Admit: 2017-08-10 | Discharge: 2017-08-10 | Disposition: A | Discharge status: Home / Self Care | Payer: Medicare Other | Source: Ambulatory Visit | Attending: Student | Admitting: Student

## 2017-08-10 DIAGNOSIS — M4802 Spinal stenosis, cervical region: Secondary | ICD-10-CM | POA: Diagnosis not present

## 2017-08-10 DIAGNOSIS — M5412 Radiculopathy, cervical region: Secondary | ICD-10-CM

## 2017-08-16 ENCOUNTER — Other Ambulatory Visit: Payer: Self-pay | Admitting: Neurological Surgery

## 2017-08-16 DIAGNOSIS — Z6825 Body mass index (BMI) 25.0-25.9, adult: Secondary | ICD-10-CM | POA: Diagnosis not present

## 2017-08-16 DIAGNOSIS — M5412 Radiculopathy, cervical region: Secondary | ICD-10-CM | POA: Diagnosis not present

## 2017-08-16 DIAGNOSIS — R030 Elevated blood-pressure reading, without diagnosis of hypertension: Secondary | ICD-10-CM | POA: Diagnosis not present

## 2017-08-30 NOTE — Pre-Procedure Instructions (Signed)
TRAMELL PIECHOTA  08/30/2017      [facility] [phone] - [address], [address] AT [address] [place] [address] Phone: [phone] Fax: [phone]    Your procedure is scheduled on   Thursday  09/05/17  Report to [facility] Admitting at 530 A.M.  Call this number if you have problems the morning of surgery:  [phone]   Remember:  Do not eat food or drink liquids after midnight.  Take these medicines the morning of surgery with A SIP OF WATER  - OMEPRAZOLE (PRILOSEC), PREDNISONE, PYRIDOSTIGMINE(MESTINON)  7 days prior to surgery STOP taking any Aspirin(unless otherwise instructed by your surgeon), Aleve, Naproxen, Ibuprofen, Motrin, Advil, Goody's, BC's, all herbal medications, fish oil, and all vitamins, SAW PALMETTO, DICLOFENAC/VOLTAREN GEL , CO Q10   Do not wear jewelry, make-up or nail polish.  Do not wear lotions, powders, or perfumes, or deodorant.  Do not shave 48 hours prior to surgery.  Men may shave face and neck.  Do not bring valuables to the hospital.  [facility] is not responsible for any belongings or valuables.  Contacts, dentures or bridgework may not be worn into surgery.  Leave your suitcase in the car.  After surgery it may be brought to your room.  For patients admitted to the hospital, discharge time will be determined by your treatment team.  Patients discharged the day of surgery will not be allowed to drive home.   Name and phone number of your driver:    Special instructions:  [facility] - Preparing for Surgery  Before surgery, you can play an important role.  Because skin is not sterile, your skin needs to be as free of germs as possible.  You can reduce the number of germs on you skin by washing with CHG (chlorahexidine gluconate) soap before surgery.  CHG is an antiseptic cleaner which kills germs and bonds with the skin to continue killing germs even after  washing.  Please DO NOT use if you have an allergy to CHG or antibacterial soaps.  If your skin becomes reddened/irritated stop using the CHG and inform your nurse when you arrive at Short Stay.  Do not shave (including legs and underarms) for at least 48 hours prior to the first CHG shower.  You may shave your face.  Please follow these instructions carefully:   1.  Shower with CHG Soap the night before surgery and the                                morning of Surgery.  2.  If you choose to wash your hair, wash your hair first as usual with your       normal shampoo.  3.  After you shampoo, rinse your hair and body thoroughly to remove the                      Shampoo.  4.  Use CHG as you would any other liquid soap.  You can apply chg directly       to the skin and wash gently with scrungie or a clean washcloth.  5.  Apply the CHG Soap to your body ONLY FROM THE NECK DOWN.        Do not use on open wounds or open sores.  Avoid contact with your  eyes,       ears, mouth and genitals (private parts).  Wash genitals (private parts)       with your normal soap.  6.  Wash thoroughly, paying special attention to the area where your surgery        will be performed.  7.  Thoroughly rinse your body with warm water from the neck down.  8.  DO NOT shower/wash with your normal soap after using and rinsing off       the CHG Soap.  9.  Pat yourself dry with a clean towel.            10.  Wear clean pajamas.            11.  Place clean sheets on your bed the night of your first shower and do not        sleep with pets.  Day of Surgery  Do not apply any lotions/deoderants the morning of surgery.  Please wear clean clothes to the hospital/surgery center.    Please read over the following fact sheets that you were given. MRSA Information and Surgical Site Infection Prevention

## 2017-09-02 ENCOUNTER — Ambulatory Visit (HOSPITAL_COMMUNITY)
Admission: RE | Admit: 2017-09-02 | Discharge: 2017-09-02 | Disposition: A | Discharge status: Home / Self Care | Payer: Medicare Other | Source: Ambulatory Visit | Attending: Neurological Surgery | Admitting: Neurological Surgery

## 2017-09-02 ENCOUNTER — Encounter (HOSPITAL_COMMUNITY): Payer: Self-pay

## 2017-09-02 ENCOUNTER — Encounter (HOSPITAL_COMMUNITY)
Admission: RE | Admit: 2017-09-02 | Discharge: 2017-09-02 | Disposition: A | Discharge status: Home / Self Care | Payer: Medicare Other | Source: Ambulatory Visit | Attending: Neurological Surgery | Admitting: Neurological Surgery

## 2017-09-02 ENCOUNTER — Other Ambulatory Visit: Payer: Self-pay

## 2017-09-02 DIAGNOSIS — Z01812 Encounter for preprocedural laboratory examination: Secondary | ICD-10-CM | POA: Insufficient documentation

## 2017-09-02 DIAGNOSIS — Z0183 Encounter for blood typing: Secondary | ICD-10-CM | POA: Diagnosis not present

## 2017-09-02 DIAGNOSIS — M4802 Spinal stenosis, cervical region: Secondary | ICD-10-CM | POA: Diagnosis not present

## 2017-09-02 DIAGNOSIS — Z01818 Encounter for other preprocedural examination: Secondary | ICD-10-CM | POA: Insufficient documentation

## 2017-09-02 DIAGNOSIS — M4322 Fusion of spine, cervical region: Secondary | ICD-10-CM | POA: Diagnosis not present

## 2017-09-02 DIAGNOSIS — J9811 Atelectasis: Secondary | ICD-10-CM | POA: Diagnosis not present

## 2017-09-02 HISTORY — DX: Abnormal results of kidney function studies: R94.4

## 2017-09-02 LAB — CBC WITH DIFFERENTIAL/PLATELET
BASOS ABS: 0 10*3/uL (ref 0.0–0.1)
BASOS PCT: 0 %
EOS ABS: 0 10*3/uL (ref 0.0–0.7)
EOS PCT: 0 %
HCT: 46.5 % (ref 39.0–52.0)
HEMOGLOBIN: 15.4 g/dL (ref 13.0–17.0)
Lymphocytes Relative: 7 %
Lymphs Abs: 0.7 10*3/uL (ref 0.7–4.0)
MCH: 31 pg (ref 26.0–34.0)
MCHC: 33.1 g/dL (ref 30.0–36.0)
MCV: 93.8 fL (ref 78.0–100.0)
Monocytes Absolute: 0.5 10*3/uL (ref 0.1–1.0)
Monocytes Relative: 5 %
Neutro Abs: 9 10*3/uL — ABNORMAL HIGH (ref 1.7–7.7)
Neutrophils Relative %: 88 %
PLATELETS: 212 10*3/uL (ref 150–400)
RBC: 4.96 MIL/uL (ref 4.22–5.81)
RDW: 13.6 % (ref 11.5–15.5)
WBC: 10.2 10*3/uL (ref 4.0–10.5)

## 2017-09-02 LAB — BASIC METABOLIC PANEL
Anion gap: 11 (ref 5–15)
BUN: 21 mg/dL — AB (ref 6–20)
CALCIUM: 9.4 mg/dL (ref 8.9–10.3)
CHLORIDE: 108 mmol/L (ref 101–111)
CO2: 21 mmol/L — ABNORMAL LOW (ref 22–32)
CREATININE: 1.09 mg/dL (ref 0.61–1.24)
Glucose, Bld: 118 mg/dL — ABNORMAL HIGH (ref 65–99)
Potassium: 4.2 mmol/L (ref 3.5–5.1)
SODIUM: 140 mmol/L (ref 135–145)

## 2017-09-02 LAB — SURGICAL PCR SCREEN
MRSA, PCR: NEGATIVE
STAPHYLOCOCCUS AUREUS: NEGATIVE

## 2017-09-02 LAB — TYPE AND SCREEN
ABO/RH(D): O POS
Antibody Screen: NEGATIVE

## 2017-09-02 LAB — PROTIME-INR
INR: 1.02
PROTHROMBIN TIME: 13.3 s (ref 11.4–15.2)

## 2017-09-02 LAB — ABO/RH: ABO/RH(D): O POS

## 2017-09-02 NOTE — Progress Notes (Signed)
REQUESTED LAST EKG, CXR FROM [facility]- [phone].  (PATIENT AND SPOUSE REQUESTED CXR BE DONE AS SURGEON ORDERED JUST BECAUSE WAS [place] IN November.

## 2017-09-05 ENCOUNTER — Encounter (HOSPITAL_COMMUNITY): Payer: Self-pay | Admitting: Neurological Surgery

## 2017-09-05 ENCOUNTER — Inpatient Hospital Stay (HOSPITAL_COMMUNITY): Payer: Medicare Other | Admitting: Emergency Medicine

## 2017-09-05 ENCOUNTER — Observation Stay (HOSPITAL_COMMUNITY)
Admission: RE | Admit: 2017-09-05 | Discharge: 2017-09-06 | Disposition: A | Discharge status: Home / Self Care | Payer: Medicare Other | Source: Ambulatory Visit | Attending: Neurological Surgery | Admitting: Neurological Surgery

## 2017-09-05 ENCOUNTER — Encounter (HOSPITAL_COMMUNITY)
Admission: RE | Disposition: A | Discharge status: Home / Self Care | Payer: Self-pay | Source: Ambulatory Visit | Attending: Neurological Surgery

## 2017-09-05 ENCOUNTER — Inpatient Hospital Stay (HOSPITAL_COMMUNITY): Payer: Medicare Other

## 2017-09-05 ENCOUNTER — Inpatient Hospital Stay (HOSPITAL_COMMUNITY): Payer: Medicare Other | Admitting: Anesthesiology

## 2017-09-05 DIAGNOSIS — G7000 Myasthenia gravis without (acute) exacerbation: Secondary | ICD-10-CM | POA: Diagnosis not present

## 2017-09-05 DIAGNOSIS — M50322 Other cervical disc degeneration at C5-C6 level: Secondary | ICD-10-CM | POA: Diagnosis not present

## 2017-09-05 DIAGNOSIS — K2270 Barrett's esophagus without dysplasia: Secondary | ICD-10-CM | POA: Diagnosis not present

## 2017-09-05 DIAGNOSIS — Z85828 Personal history of other malignant neoplasm of skin: Secondary | ICD-10-CM | POA: Insufficient documentation

## 2017-09-05 DIAGNOSIS — K449 Diaphragmatic hernia without obstruction or gangrene: Secondary | ICD-10-CM | POA: Insufficient documentation

## 2017-09-05 DIAGNOSIS — Z880 Allergy status to penicillin: Secondary | ICD-10-CM | POA: Insufficient documentation

## 2017-09-05 DIAGNOSIS — M19019 Primary osteoarthritis, unspecified shoulder: Secondary | ICD-10-CM | POA: Insufficient documentation

## 2017-09-05 DIAGNOSIS — M50121 Cervical disc disorder at C4-C5 level with radiculopathy: Secondary | ICD-10-CM | POA: Insufficient documentation

## 2017-09-05 DIAGNOSIS — M4802 Spinal stenosis, cervical region: Principal | ICD-10-CM | POA: Insufficient documentation

## 2017-09-05 DIAGNOSIS — M50323 Other cervical disc degeneration at C6-C7 level: Secondary | ICD-10-CM | POA: Diagnosis not present

## 2017-09-05 DIAGNOSIS — Z79899 Other long term (current) drug therapy: Secondary | ICD-10-CM | POA: Diagnosis not present

## 2017-09-05 DIAGNOSIS — M4322 Fusion of spine, cervical region: Secondary | ICD-10-CM | POA: Diagnosis present

## 2017-09-05 DIAGNOSIS — Z419 Encounter for procedure for purposes other than remedying health state, unspecified: Secondary | ICD-10-CM

## 2017-09-05 DIAGNOSIS — M50321 Other cervical disc degeneration at C4-C5 level: Secondary | ICD-10-CM | POA: Diagnosis not present

## 2017-09-05 DIAGNOSIS — Z888 Allergy status to other drugs, medicaments and biological substances status: Secondary | ICD-10-CM | POA: Insufficient documentation

## 2017-09-05 HISTORY — PX: ANTERIOR CERVICAL DECOMP/DISCECTOMY FUSION: SHX1161

## 2017-09-05 SURGERY — ANTERIOR CERVICAL DECOMPRESSION/DISCECTOMY FUSION 3 LEVELS
Anesthesia: General | Site: Spine Cervical

## 2017-09-05 MED ORDER — HYDROMORPHONE HCL 1 MG/ML IJ SOLN
0.5000 mg | INTRAMUSCULAR | Status: DC | PRN
  Administered 2017-09-05 (×2): 0.5 mg via INTRAVENOUS
  Filled 2017-09-05 (×2): qty 0.5

## 2017-09-05 MED ORDER — CEFAZOLIN SODIUM-DEXTROSE 2-4 GM/100ML-% IV SOLN
INTRAVENOUS | Status: AC
  Filled 2017-09-05: qty 100

## 2017-09-05 MED ORDER — CHLORHEXIDINE GLUCONATE CLOTH 2 % EX PADS: 6.0000 | MEDICATED_PAD | Freq: Once | CUTANEOUS | Status: DC

## 2017-09-05 MED ORDER — CEFAZOLIN SODIUM-DEXTROSE 2-4 GM/100ML-% IV SOLN
2.0000 g | INTRAVENOUS | Status: AC
  Administered 2017-09-05: 2 g via INTRAVENOUS

## 2017-09-05 MED ORDER — PROPOFOL 10 MG/ML IV BOLUS
INTRAVENOUS | Status: DC | PRN
  Administered 2017-09-05: 200 mg via INTRAVENOUS

## 2017-09-05 MED ORDER — THROMBIN (RECOMBINANT) 20000 UNITS EX SOLR
CUTANEOUS | Status: AC
  Filled 2017-09-05: qty 20000

## 2017-09-05 MED ORDER — PROPOFOL 500 MG/50ML IV EMUL
INTRAVENOUS | Status: DC | PRN
  Administered 2017-09-05: 50 ug/kg/min via INTRAVENOUS

## 2017-09-05 MED ORDER — FENTANYL CITRATE (PF) 250 MCG/5ML IJ SOLN
INTRAMUSCULAR | Status: AC
  Filled 2017-09-05: qty 5

## 2017-09-05 MED ORDER — ACETAMINOPHEN 650 MG RE SUPP: 650.0000 mg | RECTAL | Status: DC | PRN

## 2017-09-05 MED ORDER — HEMOSTATIC AGENTS (NO CHARGE) OPTIME
TOPICAL | Status: DC | PRN
  Administered 2017-09-05: 1 via TOPICAL

## 2017-09-05 MED ORDER — LACTATED RINGERS IV SOLN
INTRAVENOUS | Status: DC | PRN
Start: 2017-09-05 — End: 2017-09-05
  Administered 2017-09-05: 08:00:00 via INTRAVENOUS

## 2017-09-05 MED ORDER — ONDANSETRON HCL 4 MG/2ML IJ SOLN
INTRAMUSCULAR | Status: DC | PRN
  Administered 2017-09-05: 4 mg via INTRAVENOUS

## 2017-09-05 MED ORDER — PROPOFOL 1000 MG/100ML IV EMUL
INTRAVENOUS | Status: AC
Start: 2017-09-05 — End: ?
  Filled 2017-09-05: qty 100

## 2017-09-05 MED ORDER — THROMBIN (RECOMBINANT) 20000 UNITS EX SOLR
CUTANEOUS | Status: DC | PRN
  Administered 2017-09-05: 20000 [IU] via TOPICAL

## 2017-09-05 MED ORDER — ONDANSETRON HCL 4 MG/2ML IJ SOLN
INTRAMUSCULAR | Status: AC
  Filled 2017-09-05: qty 2

## 2017-09-05 MED ORDER — HYDROCODONE-ACETAMINOPHEN 5-325 MG PO TABS
1.0000 | ORAL_TABLET | ORAL | Status: DC | PRN
  Administered 2017-09-05: 1 via ORAL
  Filled 2017-09-05: qty 1

## 2017-09-05 MED ORDER — PHENYLEPHRINE HCL 10 MG/ML IJ SOLN
INTRAMUSCULAR | Status: DC | PRN
  Administered 2017-09-05 (×2): 80 ug via INTRAVENOUS

## 2017-09-05 MED ORDER — ONDANSETRON HCL 4 MG/2ML IJ SOLN: 4.0000 mg | Freq: Four times a day (QID) | INTRAMUSCULAR | Status: DC | PRN

## 2017-09-05 MED ORDER — CEFAZOLIN SODIUM-DEXTROSE 2-4 GM/100ML-% IV SOLN
2.0000 g | Freq: Three times a day (TID) | INTRAVENOUS | Status: AC
  Administered 2017-09-05 (×2): 2 g via INTRAVENOUS
  Filled 2017-09-05 (×2): qty 100

## 2017-09-05 MED ORDER — THROMBIN (RECOMBINANT) 5000 UNITS EX SOLR
CUTANEOUS | Status: DC | PRN
  Administered 2017-09-05: 5000 [IU] via TOPICAL

## 2017-09-05 MED ORDER — FENTANYL CITRATE (PF) 100 MCG/2ML IJ SOLN
INTRAMUSCULAR | Status: AC
  Administered 2017-09-05: 25 ug via INTRAVENOUS
  Filled 2017-09-05: qty 2

## 2017-09-05 MED ORDER — BUPIVACAINE HCL (PF) 0.25 % IJ SOLN
INTRAMUSCULAR | Status: DC | PRN
  Administered 2017-09-05: 5 mL

## 2017-09-05 MED ORDER — SODIUM CHLORIDE 0.9% FLUSH: 3.0000 mL | INTRAVENOUS | Status: DC | PRN

## 2017-09-05 MED ORDER — LIDOCAINE HCL (CARDIAC) 20 MG/ML IV SOLN
INTRAVENOUS | Status: DC | PRN
  Administered 2017-09-05: 40 mg via INTRAVENOUS

## 2017-09-05 MED ORDER — FENTANYL CITRATE (PF) 100 MCG/2ML IJ SOLN
INTRAMUSCULAR | Status: DC | PRN
  Administered 2017-09-05: 50 ug via INTRAVENOUS
  Administered 2017-09-05: 100 ug via INTRAVENOUS
  Administered 2017-09-05 (×3): 50 ug via INTRAVENOUS

## 2017-09-05 MED ORDER — THROMBIN (RECOMBINANT) 5000 UNITS EX SOLR
CUTANEOUS | Status: AC
  Filled 2017-09-05: qty 5000

## 2017-09-05 MED ORDER — POTASSIUM CHLORIDE IN NACL 20-0.9 MEQ/L-% IV SOLN: INTRAVENOUS | Status: DC

## 2017-09-05 MED ORDER — HYDROCODONE-ACETAMINOPHEN 5-325 MG PO TABS
1.0000 | ORAL_TABLET | ORAL | Status: DC | PRN
  Administered 2017-09-05 – 2017-09-06 (×5): 2 via ORAL
  Filled 2017-09-05 (×5): qty 2

## 2017-09-05 MED ORDER — 0.9 % SODIUM CHLORIDE (POUR BTL) OPTIME
TOPICAL | Status: DC | PRN
  Administered 2017-09-05: 1000 mL

## 2017-09-05 MED ORDER — SODIUM CHLORIDE 0.9% FLUSH
3.0000 mL | Freq: Two times a day (BID) | INTRAVENOUS | Status: DC
  Administered 2017-09-05: 3 mL via INTRAVENOUS

## 2017-09-05 MED ORDER — SENNA 8.6 MG PO TABS
1.0000 | ORAL_TABLET | Freq: Two times a day (BID) | ORAL | Status: DC
  Administered 2017-09-05: 8.6 mg via ORAL
  Filled 2017-09-05: qty 1

## 2017-09-05 MED ORDER — LACTATED RINGERS IV SOLN
INTRAVENOUS | Status: DC | PRN
  Administered 2017-09-05: 07:00:00 via INTRAVENOUS

## 2017-09-05 MED ORDER — DEXAMETHASONE SODIUM PHOSPHATE 4 MG/ML IJ SOLN
4.0000 mg | Freq: Four times a day (QID) | INTRAMUSCULAR | Status: DC
  Administered 2017-09-05 – 2017-09-06 (×2): 4 mg via INTRAVENOUS
  Filled 2017-09-05 (×2): qty 1

## 2017-09-05 MED ORDER — PREDNISONE 20 MG PO TABS
20.0000 mg | ORAL_TABLET | Freq: Every day | ORAL | Status: DC
  Filled 2017-09-05: qty 1

## 2017-09-05 MED ORDER — BUPIVACAINE HCL (PF) 0.25 % IJ SOLN
INTRAMUSCULAR | Status: AC
  Filled 2017-09-05: qty 30

## 2017-09-05 MED ORDER — LIDOCAINE 2% (20 MG/ML) 5 ML SYRINGE
INTRAMUSCULAR | Status: AC
Start: 2017-09-05 — End: ?
  Filled 2017-09-05: qty 5

## 2017-09-05 MED ORDER — ACETAMINOPHEN 325 MG PO TABS: 650.0000 mg | ORAL_TABLET | ORAL | Status: DC | PRN

## 2017-09-05 MED ORDER — DEXAMETHASONE SODIUM PHOSPHATE 10 MG/ML IJ SOLN
INTRAMUSCULAR | Status: AC
  Filled 2017-09-05: qty 1

## 2017-09-05 MED ORDER — PROPOFOL 10 MG/ML IV BOLUS
INTRAVENOUS | Status: AC
  Filled 2017-09-05: qty 40

## 2017-09-05 MED ORDER — MIDAZOLAM HCL 5 MG/5ML IJ SOLN
INTRAMUSCULAR | Status: DC | PRN
  Administered 2017-09-05: 1 mg via INTRAVENOUS

## 2017-09-05 MED ORDER — ONDANSETRON HCL 4 MG/2ML IJ SOLN: 4.0000 mg | Freq: Once | INTRAMUSCULAR | Status: DC | PRN

## 2017-09-05 MED ORDER — PHENOL 1.4 % MT LIQD: 1.0000 | OROMUCOSAL | Status: DC | PRN

## 2017-09-05 MED ORDER — MENTHOL 3 MG MT LOZG: 1.0000 | LOZENGE | OROMUCOSAL | Status: DC | PRN

## 2017-09-05 MED ORDER — DEXAMETHASONE SODIUM PHOSPHATE 10 MG/ML IJ SOLN
10.0000 mg | INTRAMUSCULAR | Status: AC
  Administered 2017-09-05: 10 mg via INTRAVENOUS

## 2017-09-05 MED ORDER — DEXAMETHASONE 4 MG PO TABS
4.0000 mg | ORAL_TABLET | Freq: Four times a day (QID) | ORAL | Status: DC
  Administered 2017-09-05 – 2017-09-06 (×3): 4 mg via ORAL
  Filled 2017-09-05 (×3): qty 1

## 2017-09-05 MED ORDER — SODIUM CHLORIDE 0.9 % IR SOLN
Status: DC | PRN
  Administered 2017-09-05: 500 mL

## 2017-09-05 MED ORDER — PYRIDOSTIGMINE BROMIDE 60 MG PO TABS
120.0000 mg | ORAL_TABLET | Freq: Three times a day (TID) | ORAL | Status: DC
  Filled 2017-09-05 (×5): qty 2

## 2017-09-05 MED ORDER — FENTANYL CITRATE (PF) 100 MCG/2ML IJ SOLN
25.0000 ug | INTRAMUSCULAR | Status: DC | PRN
  Administered 2017-09-05 (×2): 25 ug via INTRAVENOUS
  Administered 2017-09-05: 50 ug via INTRAVENOUS

## 2017-09-05 MED ORDER — MIDAZOLAM HCL 2 MG/2ML IJ SOLN
INTRAMUSCULAR | Status: AC
  Filled 2017-09-05: qty 2

## 2017-09-05 MED ORDER — SODIUM CHLORIDE 0.9 % IV SOLN: 250.0000 mL | INTRAVENOUS | Status: DC

## 2017-09-05 MED ORDER — ONDANSETRON HCL 4 MG PO TABS: 4.0000 mg | ORAL_TABLET | Freq: Four times a day (QID) | ORAL | Status: DC | PRN

## 2017-09-05 SURGICAL SUPPLY — 56 items
BAG DECANTER FOR FLEXI CONT (MISCELLANEOUS) ×3 IMPLANT
BASKET BONE COLLECTION (BASKET) ×3 IMPLANT
BENZOIN TINCTURE PRP APPL 2/3 (GAUZE/BANDAGES/DRESSINGS) ×3 IMPLANT
BIT DRILL 13 (BIT) ×2 IMPLANT
BIT DRILL 13MM (BIT) ×1
BUR MATCHSTICK NEURO 3.0 LAGG (BURR) ×3 IMPLANT
CAGE C-PTI 4X16X18 7D LG (Cage) ×6 IMPLANT
CANISTER SUCT 3000ML PPV (MISCELLANEOUS) ×3 IMPLANT
CARTRIDGE OIL MAESTRO DRILL (MISCELLANEOUS) ×1 IMPLANT
CLOSURE WOUND 1/2 X4 (GAUZE/BANDAGES/DRESSINGS) ×1
COVER TABLE BACK 60X90 (DRAPES) ×3 IMPLANT
DERMABOND ADVANCED (GAUZE/BANDAGES/DRESSINGS) ×2
DERMABOND ADVANCED .7 DNX12 (GAUZE/BANDAGES/DRESSINGS) ×1 IMPLANT
DIFFUSER DRILL AIR PNEUMATIC (MISCELLANEOUS) ×3 IMPLANT
DRAPE C-ARM 42X72 X-RAY (DRAPES) ×6 IMPLANT
DRAPE LAPAROTOMY 100X72 PEDS (DRAPES) ×3 IMPLANT
DRAPE MICROSCOPE LEICA (MISCELLANEOUS) ×3 IMPLANT
DRAPE POUCH INSTRU U-SHP 10X18 (DRAPES) IMPLANT
DRSG OPSITE POSTOP 3X4 (GAUZE/BANDAGES/DRESSINGS) ×3 IMPLANT
DURAPREP 6ML APPLICATOR 50/CS (WOUND CARE) ×3 IMPLANT
ELECT COATED BLADE 2.86 ST (ELECTRODE) ×3 IMPLANT
ELECT REM PT RETURN 9FT ADLT (ELECTROSURGICAL) ×3
ELECTRODE REM PT RTRN 9FT ADLT (ELECTROSURGICAL) ×1 IMPLANT
GAUZE SPONGE 4X4 16PLY XRAY LF (GAUZE/BANDAGES/DRESSINGS) IMPLANT
GLOVE BIO SURGEON STRL SZ7 (GLOVE) ×3 IMPLANT
GLOVE BIO SURGEON STRL SZ8 (GLOVE) ×3 IMPLANT
GLOVE BIOGEL PI IND STRL 7.0 (GLOVE) ×1 IMPLANT
GLOVE BIOGEL PI INDICATOR 7.0 (GLOVE) ×2
GLOVE SURG SS PI 7.5 STRL IVOR (GLOVE) ×12 IMPLANT
GOWN STRL REUS W/ TWL LRG LVL3 (GOWN DISPOSABLE) ×3 IMPLANT
GOWN STRL REUS W/ TWL XL LVL3 (GOWN DISPOSABLE) ×1 IMPLANT
GOWN STRL REUS W/TWL 2XL LVL3 (GOWN DISPOSABLE) IMPLANT
GOWN STRL REUS W/TWL LRG LVL3 (GOWN DISPOSABLE) ×6
GOWN STRL REUS W/TWL XL LVL3 (GOWN DISPOSABLE) ×2
HEMOSTAT POWDER KIT SURGIFOAM (HEMOSTASIS) ×3 IMPLANT
KIT BASIN OR (CUSTOM PROCEDURE TRAY) ×3 IMPLANT
KIT ROOM TURNOVER OR (KITS) ×3 IMPLANT
NEEDLE HYPO 25X1 1.5 SAFETY (NEEDLE) ×3 IMPLANT
NEEDLE SPNL 20GX3.5 QUINCKE YW (NEEDLE) ×3 IMPLANT
NS IRRIG 1000ML POUR BTL (IV SOLUTION) ×3 IMPLANT
OIL CARTRIDGE MAESTRO DRILL (MISCELLANEOUS) ×3
PACK LAMINECTOMY NEURO (CUSTOM PROCEDURE TRAY) ×3 IMPLANT
PAD ARMBOARD 7.5X6 YLW CONV (MISCELLANEOUS) ×9 IMPLANT
PIN DISTRACTION 14MM (PIN) ×6 IMPLANT
PLATE 2 37.5XLCK NS SPNE CVD (Plate) ×1 IMPLANT
PLATE 2 ATLANTIS TRANS (Plate) ×2 IMPLANT
RUBBERBAND STERILE (MISCELLANEOUS) ×6 IMPLANT
SCREW ST 14X4XST FXANG SPNE (Screw) ×6 IMPLANT
SCREW ST FIX 4 ATL (Screw) ×12 IMPLANT
SPONGE INTESTINAL PEANUT (DISPOSABLE) ×3 IMPLANT
SPONGE SURGIFOAM ABS GEL 100 (HEMOSTASIS) ×3 IMPLANT
STRIP CLOSURE SKIN 1/2X4 (GAUZE/BANDAGES/DRESSINGS) ×2 IMPLANT
SUT VIC AB 3-0 SH 8-18 (SUTURE) ×6 IMPLANT
TOWEL GREEN STERILE (TOWEL DISPOSABLE) ×3 IMPLANT
TOWEL GREEN STERILE FF (TOWEL DISPOSABLE) ×3 IMPLANT
WATER STERILE IRR 1000ML POUR (IV SOLUTION) ×3 IMPLANT

## 2017-09-05 NOTE — Anesthesia Preprocedure Evaluation (Signed)
Anesthesia Evaluation  Patient identified by MRN, date of birth, ID band Patient awake    Reviewed: Allergy & Precautions, NPO status , Patient's Chart, lab work & pertinent test results  Airway Mallampati: II  TM Distance: >3 FB Neck ROM: Full    Dental  (+) Teeth Intact, Dental Advisory Given   Pulmonary    breath sounds clear to auscultation       Cardiovascular  Rhythm:Regular Rate:Normal     Neuro/Psych    GI/Hepatic   Endo/Other    Renal/GU      Musculoskeletal   Abdominal   Peds  Hematology   Anesthesia Other Findings   Reproductive/Obstetrics                             Anesthesia Physical Anesthesia Plan  ASA: III  Anesthesia Plan: General   Post-op Pain Management:    Induction: Intravenous  PONV Risk Score and Plan: 1 and Ondansetron and Dexamethasone  Airway Management Planned: Oral ETT  Additional Equipment:   Intra-op Plan:   Post-operative Plan: Extubation in OR  Informed Consent: I have reviewed the patients History and Physical, chart, labs and discussed the procedure including the risks, benefits and alternatives for the proposed anesthesia with the patient or authorized representative who has indicated his/her understanding and acceptance.   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

## 2017-09-05 NOTE — Transfer of Care (Signed)
Immediate Anesthesia Transfer of Care Note  Patient: Reginald Ford  Procedure(s) Performed: ANTERIOR CERVICAL DECOMPRESSION FUSION CERVICAL FOUR-FIVE,CERVICAL FIVE-SIX. (N/A Spine Cervical)  Patient Location: PACU  Anesthesia Type:General  Level of Consciousness: awake, alert , oriented and sedated  Airway & Oxygen Therapy: Patient Spontanous Breathing and Patient connected to nasal cannula oxygen  Post-op Assessment: Report given to RN, Post -op Vital signs reviewed and stable and Patient moving all extremities  Post vital signs: Reviewed and stable  Last Vitals:  Vitals:   09/05/17 0611  BP: (!) 161/91  Pulse: 64  Resp: 20  Temp: 36.8 C  SpO2: 98%    Last Pain:  Vitals:   09/05/17 0611  TempSrc: Oral         Complications: No apparent anesthesia complications

## 2017-09-05 NOTE — Progress Notes (Signed)
Orthopedic Tech Progress Note Patient Details:  Reginald Ford 1940/11/02 [id number]  Ortho Devices Type of Ortho Device: Soft collar Ortho Device/Splint Location: neck Ortho Device/Splint Interventions: Application   Post Interventions Patient Tolerated: Well Instructions Provided: Care of device   [name] 09/05/2017, 12:24 PM

## 2017-09-05 NOTE — Op Note (Signed)
09/05/2017  10:09 AM  PATIENT:  Emeline Darling  77 y.o. 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 Decompressive anterior cervical discectomy C4-5 C5-6, 2. Anterior cervical arthrodesis C4-5 C5-6 utilizing a porous titanium interbody cage packed with locally harvested morcellized autologous bone graft, 3. Anterior cervical plating C4-C6 inclusive utilizing a Atlantis translational plate  SURGEON:  Sherley Bounds, MD  ASSISTANTS: Dr. Sherwood Gambler  ANESTHESIA:   General  EBL: 15 ml  Total I/O In: 950 [I.V.:950] Out: 15 [Blood:15]  BLOOD ADMINISTERED: none  DRAINS: none  SPECIMEN:  none  INDICATION FOR PROCEDURE: This patient presented with neck and shoulder pain. Imaging showed degenerative disc disease C4-5 C5-C6 7 with compressive stenosis at C4-5 and C5-6. The patient tried conservative measures without relief. Pain was debilitating. Recommended ACDF with plating C4-5 C5-C6 C6-7. Patient understood the risks, benefits, and alternatives and potential outcomes and wished to proceed. During the surgery we found him to have an element of bone softening from his prolonged prednisone, and given his history of myasthenia gravis and his age Dr. Sherwood Gambler and I did not feel the risk of doing the ACDF at C6-7 with potential benefits, therefore we decided to do only C4-5 and C5-6 which with a compressive levels  PROCEDURE DETAILS: Patient was brought to the operating room placed under general endotracheal anesthesia. Patient was placed in the supine position on the operating room table. The neck was prepped with Duraprep and draped in a sterile fashion.   Three cc of local anesthesia was injected and a transverse incision was made on the right side of the neck.  Dissection was carried down thru the subcutaneous tissue and the platysma was  elevated, opened, and  undermined with Metzenbaum scissors.  Dissection was then carried out thru an avascular plane leaving the sternocleidomastoid carotid artery and jugular vein laterally and the trachea and esophagus medially. The ventral aspect of the vertebral column was identified and a localizing x-ray was taken. The C4-5 level was identified. The longus colli muscles were then elevated and the retractor was placed to expose C4-5 C5-6 and C6-7. I started at [facility],  but the exact same decompression was done at C4-5. Again, after these 2 levels were done we had a discussion and decided not to perform C6-7 because of the lack of compressive stenosis and his comorbidities.  The annulus was incised and the disc space entered. Discectomy was performed with micro-curettes and pituitary rongeurs. I then used the high-speed drill to drill the endplates down to the level of the posterior longitudinal ligament. The drill shavings were saved in a mucous trap for later arthrodesis. The operating microscope was draped and brought into the field provided additional magnification, illumination and visualization. Discectomy was continued posteriorly thru the disc space. Posterior longitudinal ligament was opened with a nerve hook, and then removed along with disc herniation and osteophytes, decompressing the spinal canal and thecal sac. We then continued to remove osteophytic overgrowth and disc material decompressing the neural foramina and exiting nerve roots bilaterally. The scope was angled up and down to help decompress and undercut the vertebral bodies. Once the decompression was completed we could pass a nerve hook circumferentially to assure adequate decompression in the midline and in the neural foramina. So by both visualization and palpation we felt we had an adequate decompression of the neural elements. We then measured the height  of the intravertebral disc space and selected a 6 millimeter PTi interbody cage packed with autograft . It  was then gently positioned in the intravertebral disc space(s) and countersunk. I then used an Atlantis translational plate and placed variable angle 14 mm screws into the vertebral bodies of C4-C5 and C6 and locked them into position. The wound was irrigated with bacitracin solution, checked for hemostasis which was established and confirmed. Once meticulous hemostasis was achieved, we then proceeded with closure. The platysma was closed with interrupted 3-0 undyed Vicryl suture, the subcuticular layer was closed with interrupted 3-0 undyed Vicryl suture. The skin edges were approximated with steristrips. The drapes were removed. A sterile dressing was applied. The patient was then awakened from general anesthesia and transferred to the recovery room in stable condition. At the end of the procedure all sponge, needle and instrument counts were correct.   PLAN OF CARE: Admit for overnight observation  PATIENT DISPOSITION:  PACU - hemodynamically stable.   Delay start of Pharmacological VTE agent (>24hrs) due to surgical blood loss or risk of bleeding:  yes

## 2017-09-05 NOTE — Anesthesia Procedure Notes (Signed)
Procedure Name: Intubation Date/Time: 09/05/2017 7:50 AM Performed by: Scheryl Darter, CRNA Pre-anesthesia Checklist: Patient identified, Emergency Drugs available, Suction available and Patient being monitored Patient Re-evaluated:Patient Re-evaluated prior to induction Oxygen Delivery Method: Circle System Utilized Preoxygenation: Pre-oxygenation with 100% oxygen Induction Type: IV induction Ventilation: Mask ventilation without difficulty Laryngoscope Size: Miller and 2 Grade View: Grade I Tube type: Oral Number of attempts: 1 Airway Equipment and Method: Stylet and Oral airway Placement Confirmation: ETT inserted through vocal cords under direct vision,  positive ETCO2 and breath sounds checked- equal and bilateral Secured at: 23 cm Tube secured with: Tape Dental Injury: Teeth and Oropharynx as per pre-operative assessment  Comments: Pt intubated without muscle relaxant due to Myasthenia Gravis

## 2017-09-05 NOTE — Anesthesia Postprocedure Evaluation (Signed)
Anesthesia Post Note  Patient: SIMMIE CAMERER  Procedure(s) Performed: ANTERIOR CERVICAL DECOMPRESSION FUSION CERVICAL FOUR-FIVE,CERVICAL FIVE-SIX. (N/A Spine Cervical)     Patient location during evaluation: PACU Anesthesia Type: General Level of consciousness: awake and alert Pain management: pain level controlled Vital Signs Assessment: post-procedure vital signs reviewed and stable Respiratory status: spontaneous breathing, nonlabored ventilation, respiratory function stable and patient connected to nasal cannula oxygen Cardiovascular status: blood pressure returned to baseline and stable Postop Assessment: no apparent nausea or vomiting Anesthetic complications: no    Last Vitals:  Vitals:   09/05/17 1152 09/05/17 1706  BP: (!) 152/87 (!) 143/77  Pulse: 92 72  Resp: 20 20  Temp: 36.8 C 36.8 C  SpO2: 94% 92%    Last Pain:  Vitals:   09/05/17 1315  TempSrc:   PainSc: 3                  Shaley Leavens COKER

## 2017-09-05 NOTE — H&P (Signed)
Subjective:   Patient is a 77 y.o. male admitted for ACDF. The patient first presented to me with complaints of neck pain. Onset of symptoms was several months ago. The pain is described as aching and occurs all day. The pain is rated moderate, and is located in the neck and radiates to the shoulders. The symptoms have been progressive. Symptoms are exacerbated by extending head backwards, and are relieved by none.  Previous work up includes MRI of cervical spine, results: spinal stenosis.  Past Medical History:  Diagnosis Date  . Anemia    hx of iron infusions - 01/2014 and 01/2015  . Arthritis    shoulder  . Barrett's esophagus   . Cancer ([place])    basal cell cancer  . Cramps, muscle, general    if he gets dehyrated  . Diverticulitis   . History of hiatal hernia   . Kidney function test abnormal    ELEVATED LEVELS  WATCHED BY PCP  . Myasthenia gravis ([place])    "ocular" myasthenia gravis  . Pneumonia    as a child. FALL 2018      Past Surgical History:  Procedure Laterality Date  . COLON SURGERY    . COLONOSCOPY    . COLOSTOMY  02/2012   for Diverticulitis  . COLOSTOMY REVERSAL  05/2012  . LUMBAR LAMINECTOMY/DECOMPRESSION MICRODISCECTOMY Bilateral 08/18/2015   Procedure: Laminectomy and Foraminotomy - L4-L5 - L5-S1 - bilateral;  Surgeon: Eustace Moore, MD;  Location: [facility];  Service: Neurosurgery;  Laterality: Bilateral;  . VASECTOMY      Allergies  Allergen Reactions  . Aminoglycosides Other (See Comments)    Medication may cause weakness from myasthenia gravis  . Beta Adrenergic Blockers Other (See Comments)    Medication may cause weakness from myasthenia gravis  . Botulinum Toxins Other (See Comments)    Medication may cause weakness from myasthenia gravis  . Calcium Channel Blockers Other (See Comments)    Medication may cause weakness from myasthenia gravis  . Curare [Tubocurarine] Other (See Comments)    Medication may cause weakness from myasthenia gravis  .  Interferons Other (See Comments)    Medication may cause weakness from myasthenia gravis  . Iodinated Diagnostic Agents Other (See Comments)    Medication may cause weakness from myasthenia gravis  . Lithium Other (See Comments)    Medication may cause Weakness from myasthenia gravis  . Macrolides And Ketolides Other (See Comments)    Medication may cause weakness from myastenia gravis  . Other Other (See Comments)    Magnesium salts, Fluoroquinolones  . Penicillamine Other (See Comments)    Medication may cause weakness from myasthenia gravis  . Quinine Derivatives Other (See Comments)    Medication may cause weakness from myastenia gravis  . Statins Other (See Comments)    Medication may cause weakness from myasthenia gravis    Social History   Tobacco Use  . Smoking status: Never Smoker  . Smokeless tobacco: Never Used  Substance Use Topics  . Alcohol use: No    Family History  Problem Relation Age of Onset  . Hypertension Mother   . Hypotension Father    Prior to Admission medications   Medication Sig Start Date End Date Taking? Authorizing Provider  acetaminophen (TYLENOL) 500 MG tablet Take 1,000 mg by mouth every 6 (six) hours as needed (for pain.).   Yes [provider]  Alpha-Lipoic Acid 300 MG TABS Take 300 mg by mouth daily.   Yes [provider]  B Complex Vitamins (B COMPLEX 50 PO) Take 1 tablet by mouth 2 (two) times daily.   Yes [provider]  CALCIUM CITRATE PO Take 1,000 mg by mouth 2 (two) times daily.   Yes [provider]  Cholecalciferol (VITAMIN D3) 2000 units TABS Take 2,000 Units by mouth 2 (two) times daily.   Yes [provider]  Coenzyme Q10 (COQ10) 200 MG CAPS Take 200 mg by mouth daily.   Yes [provider]  diclofenac sodium (VOLTAREN) 1 % GEL Apply 2-4 g topically 4 (four) times daily as needed (for pain.).   Yes [provider]  Digestive Enzymes (DIGESTIVE ENZYME PO) Take 1  capsule by mouth 2 (two) times daily.   Yes [provider]  Magnesium Bisglycinate (MAG GLYCINATE PO) Take 400 mg by mouth 2 (two) times daily.   Yes [provider]  Multiple Vitamin (MULTIVITAMIN WITH MINERALS) TABS tablet Take 1 tablet by mouth daily.   Yes [provider]  omeprazole (PRILOSEC) 20 MG capsule Take 20 mg by mouth daily before breakfast.    Yes [provider]  predniSONE (DELTASONE) 10 MG tablet Take 5-20 mg by mouth See admin instructions. Typically takes 5 mg by mouth daily but is taking 20 mg by mouth daily for  a week before and after surgery 05/12/15  Yes [provider]  Probiotic Product (PROBIOTIC PO) Take 1 capsule by mouth 2 (two) times daily.   Yes [provider]  pyridostigmine (MESTINON) 60 MG tablet Take 120 mg by mouth 3 (three) times daily. Morning, Lunch, & supper 07/04/15  Yes [provider]  Saw Palmetto, Serenoa repens, 320 MG CAPS Take 320 mg by mouth daily.   Yes [provider]  vitamin C (ASCORBIC ACID) 500 MG tablet Take 500 mg by mouth daily.   Yes [provider]     Review of Systems  Positive ROS: neg  All other systems have been reviewed and were otherwise negative with the exception of those mentioned in the HPI and as above.  Objective: Vital signs in last 24 hours: Temp:  [98.2 F (36.8 C)] 98.2 F (36.8 C) (02/07 0611) Pulse Rate:  [64] 64 (02/07 0611) Resp:  [20] 20 (02/07 0611) BP: (161)/(91) 161/91 (02/07 0611) SpO2:  [98 %] 98 % (02/07 0611)  General Appearance: Alert, cooperative, no distress, appears stated age Head: Normocephalic, without obvious abnormality, atraumatic Eyes: PERRL, conjunctiva/corneas clear, EOM's intact      Neck: Supple, symmetrical, trachea midline, Back: Symmetric, no curvature, ROM normal, no CVA tenderness Lungs:  respirations unlabored Heart: Regular rate and rhythm Abdomen: Soft, non-tender Extremities: Extremities  normal, atraumatic, no cyanosis or edema Pulses: 2+ and symmetric all extremities Skin: Skin color, texture, turgor normal, no rashes or lesions  NEUROLOGIC:  Mental status: Alert and oriented x4, no aphasia, good attention span, fund of knowledge and memory  Motor Exam - grossly normal Sensory Exam - grossly normal Reflexes: trace Coordination - grossly normal Gait - grossly normal Balance - grossly normal Cranial Nerves: I: smell Not tested  II: visual acuity  OS: nl    OD: nl  II: visual fields Full to confrontation  II: pupils Equal, round, reactive to light  III,VII: ptosis None  III,IV,VI: extraocular muscles  Full ROM  V: mastication Normal  V: facial light touch sensation  Normal  V,VII: corneal reflex  Present  VII: facial muscle function - upper  Normal  VII: facial muscle function -  lower Normal  VIII: hearing Not tested  IX: soft palate elevation  Normal  IX,X: gag reflex Present  XI: trapezius strength  5/5  XI: sternocleidomastoid strength 5/5  XI: neck flexion strength  5/5  XII: tongue strength  Normal    Data Review Lab Results  Component Value Date   WBC 10.2 09/02/2017   HGB 15.4 09/02/2017   HCT 46.5 09/02/2017   MCV 93.8 09/02/2017   PLT 212 09/02/2017   Lab Results  Component Value Date   NA 140 09/02/2017   K 4.2 09/02/2017   CL 108 09/02/2017   CO2 21 (L) 09/02/2017   BUN 21 (H) 09/02/2017   CREATININE 1.09 09/02/2017   GLUCOSE 118 (H) 09/02/2017   Lab Results  Component Value Date   INR 1.02 09/02/2017    Assessment:   Cervical neck pain with 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 Estimated body mass index is 27.28 kg/m as calculated from the following:   Height as of 09/02/17: 5\' 7"  (1.702 m).   Weight as of 09/02/17: 79 kg (174 lb 3.2 oz).  Patient has failed conservative therapy. Planned surgery : ACDF C4-5 C5-6 C6-7  Plan:   I explained the condition and procedure to the patient and answered any  questions.  Patient wishes to proceed with procedure as planned. Understands risks/ benefits/ and expected or typical outcomes.  Mindy Gali S 09/05/2017 6:29 AM

## 2017-09-06 ENCOUNTER — Encounter (HOSPITAL_COMMUNITY): Payer: Self-pay | Admitting: Neurological Surgery

## 2017-09-06 DIAGNOSIS — M50121 Cervical disc disorder at C4-C5 level with radiculopathy: Secondary | ICD-10-CM | POA: Diagnosis not present

## 2017-09-06 DIAGNOSIS — K449 Diaphragmatic hernia without obstruction or gangrene: Secondary | ICD-10-CM | POA: Diagnosis not present

## 2017-09-06 DIAGNOSIS — M19019 Primary osteoarthritis, unspecified shoulder: Secondary | ICD-10-CM | POA: Diagnosis not present

## 2017-09-06 DIAGNOSIS — M4802 Spinal stenosis, cervical region: Secondary | ICD-10-CM | POA: Diagnosis not present

## 2017-09-06 DIAGNOSIS — Z85828 Personal history of other malignant neoplasm of skin: Secondary | ICD-10-CM | POA: Diagnosis not present

## 2017-09-06 DIAGNOSIS — K2270 Barrett's esophagus without dysplasia: Secondary | ICD-10-CM | POA: Diagnosis not present

## 2017-09-06 MED ORDER — HYDROCODONE-ACETAMINOPHEN 5-325 MG PO TABS: 1.0000 | ORAL_TABLET | Freq: Four times a day (QID) | ORAL | 0 refills | Status: DC | PRN

## 2017-09-06 NOTE — Progress Notes (Signed)
Patient alert and oriented, mae's well, voiding adequate amount of urine, swallowing without difficulty, no c/o pain at time of discharge. Patient discharged home with family. Script and discharged instructions given to patient. Patient and family stated understanding of instructions given. Patient has an appointment with Dr. Jones °

## 2017-09-06 NOTE — Discharge Summary (Signed)
Physician Discharge Summary  Patient ID: Reginald Ford MRN: [id number] DOB/AGE: 08-27-1940 77 y.o.  Admit date: 09/05/2017 Discharge date: 09/06/2017  Admission Diagnoses: cervical spondylosis/ stenosis    Discharge Diagnoses: same   Discharged Condition: good  Hospital Course: The patient was admitted on 09/05/2017 and taken to the operating room where the patient underwent ACDF. The patient tolerated the procedure well and was taken to the recovery room and then to the floor in stable condition. The hospital course was routine. There were no complications. The wound remained clean dry and intact. Pt had appropriate neck soreness. No complaints of arm pain or new N/T/W. The patient remained afebrile with stable vital signs, and tolerated a regular diet. The patient continued to increase activities, and pain was well controlled with oral pain medications.   Consults: None  Significant Diagnostic Studies:  Results for orders placed or performed during the hospital encounter of 09/02/17  Surgical pcr screen  Result Value Ref Range   MRSA, PCR NEGATIVE NEGATIVE   Staphylococcus aureus NEGATIVE NEGATIVE  Basic metabolic panel  Result Value Ref Range   Sodium 140 135 - 145 mmol/L   Potassium 4.2 3.5 - 5.1 mmol/L   Chloride 108 101 - 111 mmol/L   CO2 21 (L) 22 - 32 mmol/L   Glucose, Bld 118 (H) 65 - 99 mg/dL   BUN 21 (H) 6 - 20 mg/dL   Creatinine, Ser 1.09 0.61 - 1.24 mg/dL   Calcium 9.4 8.9 - 10.3 mg/dL   GFR calc non Af Amer >60 >60 mL/min   GFR calc Af Amer >60 >60 mL/min   Anion gap 11 5 - 15  CBC WITH DIFFERENTIAL  Result Value Ref Range   WBC 10.2 4.0 - 10.5 K/uL   RBC 4.96 4.22 - 5.81 MIL/uL   Hemoglobin 15.4 13.0 - 17.0 g/dL   HCT 46.5 39.0 - 52.0 %   MCV 93.8 78.0 - 100.0 fL   MCH 31.0 26.0 - 34.0 pg   MCHC 33.1 30.0 - 36.0 g/dL   RDW 13.6 11.5 - 15.5 %   Platelets 212 150 - 400 K/uL   Neutrophils Relative % 88 %   Neutro Abs 9.0 (H) 1.7 - 7.7 K/uL   Lymphocytes  Relative 7 %   Lymphs Abs 0.7 0.7 - 4.0 K/uL   Monocytes Relative 5 %   Monocytes Absolute 0.5 0.1 - 1.0 K/uL   Eosinophils Relative 0 %   Eosinophils Absolute 0.0 0.0 - 0.7 K/uL   Basophils Relative 0 %   Basophils Absolute 0.0 0.0 - 0.1 K/uL  Protime-INR  Result Value Ref Range   Prothrombin Time 13.3 11.4 - 15.2 seconds   INR 1.02   Type and screen [place]  Result Value Ref Range   ABO/RH(D) O POS    Antibody Screen NEG    Sample Expiration 09/16/2017    Extend sample reason      NO TRANSFUSIONS OR PREGNANCY IN THE PAST 3 MONTHS Performed at [facility], [address] [place], [place] [address]   ABO/Rh  Result Value Ref Range   ABO/RH(D)      O POS Performed at [facility] [address], [place], [place] [address]     Chest 2 View  Result Date: 09/02/2017 CLINICAL DATA:  Preoperative evaluation for cervical spine surgery EXAM: CHEST  2 VIEW COMPARISON:  08/18/2015 FINDINGS: Normal heart size and pulmonary vascularity. Slight prominence of RIGHT paratracheal soft tissues  unchanged since 2016 suspect related to vascular structures. No mass effect upon trachea. Minimal subsegmental atelectasis at LEFT base. Lungs otherwise clear. No acute infiltrate, pleural effusion or pneumothorax. Bones unremarkable. IMPRESSION: LEFT basilar subsegmental atelectasis. Electronically Signed   By: Lavonia Dana M.D.   On: 09/02/2017 16:21   Dg Cervical Spine 2-3 Views  Result Date: 09/05/2017 CLINICAL DATA:  Anterior fusion EXAM: CERVICAL SPINE - 1 VIEW; DG C-ARM 61-120 MIN COMPARISON:  Cervical spine MRI August 10, 2017 FLUOROSCOPY TIME:  0 minutes 10 seconds; 2 acquired images FINDINGS: Cross-table lateral images show anterior screw and plate fixation from R4-Y7 with support hardware intact on lateral imaging. No fracture or spondylolisthesis is evident. Visualized disc spaces appear intact. IMPRESSION: Anterior fusion from C4-C6 with support hardware  appearing intact on single view. No fracture or spondylolisthesis evident. Electronically Signed   By: Lowella Grip III M.D.   On: 09/05/2017 09:56   Mr Cervical Spine Wo Contrast  Result Date: 08/10/2017 CLINICAL DATA:  Neck pain with movement.  Cervical radiculopathy. EXAM: MRI CERVICAL SPINE WITHOUT CONTRAST TECHNIQUE: Multiplanar, multisequence MR imaging of the cervical spine was performed. No intravenous contrast was administered. COMPARISON:  None. FINDINGS: Alignment: AP alignment is anatomic. There is straightening of the normal cervical lordosis. Vertebrae: Mild endplate marrow changes are present at C4-5 and C5-6. Vertebral body heights are preserved. Cord: Normal signal is present in the cervical spine despite severe central canal stenosis. Posterior Fossa, vertebral arteries, paraspinal tissues: The craniocervical junction is normal. Visualized intracranial contents are normal. Flow is present in the major intracranial arteries. Disc levels: C2-3: A shallow central disc protrusion is present without significant stenosis. C3-4: A leftward disc osteophyte complex is present. There effacement of the ventral CSF and distortion of the cord without abnormal signal. Canal is narrowed to 8 mm. Severe left and moderate right foraminal narrowing is present. C4-5: Severe central canal stenosis is present. The canal is narrowed to less than 4 mm on the left. There is no significant cord signal abnormality. Moderate foraminal stenosis is worse on the right. C5-6: A broad-based disc osteophyte complex is present. There distortion of the cord. Canal measures 7 mm. No abnormal cord signal is present. Moderate foraminal narrowing is worse on the right. C6-7: A broad-based disc osteophyte complex is asymmetric to the right. Severe right and moderate left foraminal stenosis is present. C7-T1: Asymmetric right-sided facet hypertrophy is present. There is no significant stenosis. IMPRESSION: 1. 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 There is no abnormal cord signal. 2. Moderate central canal stenosis at C3-4 and C5-6 with some distortion of the cord but no abnormal signal. 3. Severe left and moderate right foraminal narrowing at C3-4. 4. Moderate foraminal narrowing bilaterally at C4-5 and C5-6 is worse on the right. 5. Severe right and moderate left foraminal narrowing at C6-7. Electronically Signed   By: San Morelle M.D.   On: 08/10/2017 15:19   Dg C-arm 1-60 Min  Result Date: 09/05/2017 CLINICAL DATA:  Anterior fusion EXAM: CERVICAL SPINE - 1 VIEW; DG C-ARM 61-120 MIN COMPARISON:  Cervical spine MRI August 10, 2017 FLUOROSCOPY TIME:  0 minutes 10 seconds; 2 acquired images FINDINGS: Cross-table lateral images show anterior screw and plate fixation from C6-C3 with support hardware intact on lateral imaging. No fracture or spondylolisthesis is evident. Visualized disc spaces appear intact. IMPRESSION: Anterior fusion from C4-C6 with support hardware appearing intact on single  view. No fracture or spondylolisthesis evident. Electronically Signed   By: Lowella Grip III M.D.   On: 09/05/2017 09:56    Antibiotics:  Anti-infectives (From admission, onward)   Start     Dose/Rate Route Frequency Ordered Stop   09/05/17 1600  ceFAZolin (ANCEF) IVPB 2g/100 mL premix     2 g 200 mL/hr over 30 Minutes Intravenous Every 8 hours 09/05/17 1154 09/06/17 0149   09/05/17 0835  bacitracin 50,000 Units in sodium chloride irrigation 0.9 % 500 mL irrigation  Status:  Discontinued       As needed 09/05/17 0835 09/05/17 1027   09/05/17 0546  ceFAZolin (ANCEF) 2-4 GM/100ML-% IVPB    Comments:  Starleen Arms   : cabinet override      09/05/17 0546 09/05/17 0745   09/05/17 0541  ceFAZolin (ANCEF) IVPB 2g/100 mL premix     2 g 200 mL/hr over 30 Minutes Intravenous On call to O.R. 09/05/17 0541 09/05/17 0745      Discharge Exam: Blood pressure  138/74, pulse 73, temperature 98.3 F (36.8 C), temperature source Oral, resp. rate 18, height 5\' 7"  (1.702 m), weight 78 kg (172 lb), SpO2 95 %. Neurologic: Grossly normal Dressing dry  Discharge Medications:   Allergies as of 09/06/2017      Reactions   Aminoglycosides Other (See Comments)   Medication may cause weakness from myasthenia gravis   Beta Adrenergic Blockers Other (See Comments)   Medication may cause weakness from myasthenia gravis   Botulinum Toxins Other (See Comments)   Medication may cause weakness from myasthenia gravis   Calcium Channel Blockers Other (See Comments)   Medication may cause weakness from myasthenia gravis   Curare [tubocurarine] Other (See Comments)   Medication may cause weakness from myasthenia gravis   Interferons Other (See Comments)   Medication may cause weakness from myasthenia gravis   Iodinated Diagnostic Agents Other (See Comments)   Medication may cause weakness from myasthenia gravis   Lithium Other (See Comments)   Medication may cause Weakness from myasthenia gravis   Macrolides And Ketolides Other (See Comments)   Medication may cause weakness from myastenia gravis   Other Other (See Comments)   Magnesium salts, Fluoroquinolones   Penicillamine Other (See Comments)   Medication may cause weakness from myasthenia gravis   Quinine Derivatives Other (See Comments)   Medication may cause weakness from myastenia gravis   Statins Other (See Comments)   Medication may cause weakness from myasthenia gravis      Medication List    TAKE these medications   acetaminophen 500 MG tablet Commonly known as:  TYLENOL Take 1,000 mg by mouth every 6 (six) hours as needed (for pain.).   Alpha-Lipoic Acid 300 MG Tabs Take 300 mg by mouth daily.   B COMPLEX 50 PO Take 1 tablet by mouth 2 (two) times daily.   CALCIUM CITRATE PO Take 1,000 mg by mouth 2 (two) times daily.   CoQ10 200 MG Caps Take 200 mg by mouth daily.   diclofenac  sodium 1 % Gel Commonly known as:  VOLTAREN Apply 2-4 g topically 4 (four) times daily as needed (for pain.).   DIGESTIVE ENZYME PO Take 1 capsule by mouth 2 (two) times daily.   HYDROcodone-acetaminophen 5-325 MG tablet Commonly known as:  NORCO/VICODIN Take 1-2 tablets by mouth every 6 (six) hours as needed for moderate pain ((score 4 to 6)).   MAG GLYCINATE PO Take 400 mg by mouth 2 (two) times daily.  multivitamin with minerals Tabs tablet Take 1 tablet by mouth daily.   omeprazole 20 MG capsule Commonly known as:  PRILOSEC Take 20 mg by mouth daily before breakfast.   predniSONE 10 MG tablet Commonly known as:  DELTASONE Take 5-20 mg by mouth See admin instructions. Typically takes 5 mg by mouth daily but is taking 20 mg by mouth daily for  a week before and after surgery   PROBIOTIC PO Take 1 capsule by mouth 2 (two) times daily.   pyridostigmine 60 MG tablet Commonly known as:  MESTINON Take 120 mg by mouth 3 (three) times daily. Morning, Lunch, & supper   Saw Palmetto (Serenoa repens) 320 MG Caps Take 320 mg by mouth daily.   vitamin C 500 MG tablet Commonly known as:  ASCORBIC ACID Take 500 mg by mouth daily.   Vitamin D3 2000 units Tabs Take 2,000 Units by mouth 2 (two) times daily.       Disposition: home   Final Dx: ACDF C4-5 C5-6  Discharge Instructions     Remove dressing in 72 hours   Complete by:  As directed    Call MD for:  difficulty breathing, headache or visual disturbances   Complete by:  As directed    Call MD for:  persistant nausea and vomiting   Complete by:  As directed    Call MD for:  redness, tenderness, or signs of infection (pain, swelling, redness, odor or green/yellow discharge around incision site)   Complete by:  As directed    Call MD for:  severe uncontrolled pain   Complete by:  As directed    Call MD for:  temperature >100.4   Complete by:  As directed    Diet - low sodium heart healthy   Complete by:  As  directed    Increase activity slowly   Complete by:  As directed          Signed: JONES,DAVID S 09/06/2017, 11:17 AM

## 2017-09-06 NOTE — Care Management Obs Status (Signed)
[facility] NOTIFICATION   Patient Details  Name: Reginald Ford MRN: [id number] Date of Birth: April 04, 1941   Medicare Observation Status Notification Given:  Yes    Carles Collet, RN 09/06/2017, 9:59 AM

## 2017-10-03 DIAGNOSIS — H2513 Age-related nuclear cataract, bilateral: Secondary | ICD-10-CM | POA: Diagnosis not present

## 2017-10-03 DIAGNOSIS — H5211 Myopia, right eye: Secondary | ICD-10-CM | POA: Diagnosis not present

## 2017-10-03 DIAGNOSIS — H25012 Cortical age-related cataract, left eye: Secondary | ICD-10-CM | POA: Diagnosis not present

## 2017-10-22 DIAGNOSIS — R030 Elevated blood-pressure reading, without diagnosis of hypertension: Secondary | ICD-10-CM | POA: Diagnosis not present

## 2017-10-22 DIAGNOSIS — Z6825 Body mass index (BMI) 25.0-25.9, adult: Secondary | ICD-10-CM | POA: Diagnosis not present

## 2017-10-22 DIAGNOSIS — M5412 Radiculopathy, cervical region: Secondary | ICD-10-CM | POA: Diagnosis not present

## 2017-11-05 DIAGNOSIS — R972 Elevated prostate specific antigen [PSA]: Secondary | ICD-10-CM | POA: Diagnosis not present

## 2017-11-07 DIAGNOSIS — R972 Elevated prostate specific antigen [PSA]: Secondary | ICD-10-CM | POA: Diagnosis not present

## 2017-11-07 DIAGNOSIS — N5201 Erectile dysfunction due to arterial insufficiency: Secondary | ICD-10-CM | POA: Diagnosis not present

## 2017-11-07 DIAGNOSIS — N400 Enlarged prostate without lower urinary tract symptoms: Secondary | ICD-10-CM | POA: Diagnosis not present

## 2017-11-12 DIAGNOSIS — H25811 Combined forms of age-related cataract, right eye: Secondary | ICD-10-CM | POA: Diagnosis not present

## 2017-11-12 DIAGNOSIS — H25011 Cortical age-related cataract, right eye: Secondary | ICD-10-CM | POA: Diagnosis not present

## 2017-11-12 DIAGNOSIS — H2511 Age-related nuclear cataract, right eye: Secondary | ICD-10-CM | POA: Diagnosis not present

## 2017-11-18 DIAGNOSIS — H6123 Impacted cerumen, bilateral: Secondary | ICD-10-CM | POA: Diagnosis not present

## 2017-11-22 DIAGNOSIS — D649 Anemia, unspecified: Secondary | ICD-10-CM | POA: Diagnosis not present

## 2017-11-26 DIAGNOSIS — H2512 Age-related nuclear cataract, left eye: Secondary | ICD-10-CM | POA: Diagnosis not present

## 2017-11-26 DIAGNOSIS — H25012 Cortical age-related cataract, left eye: Secondary | ICD-10-CM | POA: Diagnosis not present

## 2017-11-26 DIAGNOSIS — H25812 Combined forms of age-related cataract, left eye: Secondary | ICD-10-CM | POA: Diagnosis not present

## 2017-11-27 DIAGNOSIS — Z888 Allergy status to other drugs, medicaments and biological substances status: Secondary | ICD-10-CM | POA: Diagnosis not present

## 2017-11-27 DIAGNOSIS — Z880 Allergy status to penicillin: Secondary | ICD-10-CM | POA: Diagnosis not present

## 2017-11-27 DIAGNOSIS — Z7952 Long term (current) use of systemic steroids: Secondary | ICD-10-CM | POA: Diagnosis not present

## 2017-11-27 DIAGNOSIS — R5383 Other fatigue: Secondary | ICD-10-CM | POA: Diagnosis not present

## 2017-11-27 DIAGNOSIS — G7000 Myasthenia gravis without (acute) exacerbation: Secondary | ICD-10-CM | POA: Diagnosis not present

## 2017-11-27 DIAGNOSIS — Z79899 Other long term (current) drug therapy: Secondary | ICD-10-CM | POA: Diagnosis not present

## 2017-12-12 DIAGNOSIS — Z79899 Other long term (current) drug therapy: Secondary | ICD-10-CM | POA: Diagnosis not present

## 2017-12-12 DIAGNOSIS — K2270 Barrett's esophagus without dysplasia: Secondary | ICD-10-CM | POA: Diagnosis not present

## 2017-12-25 DIAGNOSIS — B351 Tinea unguium: Secondary | ICD-10-CM | POA: Diagnosis not present

## 2017-12-25 DIAGNOSIS — D1801 Hemangioma of skin and subcutaneous tissue: Secondary | ICD-10-CM | POA: Diagnosis not present

## 2017-12-25 DIAGNOSIS — L720 Epidermal cyst: Secondary | ICD-10-CM | POA: Diagnosis not present

## 2017-12-25 DIAGNOSIS — L821 Other seborrheic keratosis: Secondary | ICD-10-CM | POA: Diagnosis not present

## 2017-12-25 DIAGNOSIS — B353 Tinea pedis: Secondary | ICD-10-CM | POA: Diagnosis not present

## 2018-01-09 DIAGNOSIS — H35351 Cystoid macular degeneration, right eye: Secondary | ICD-10-CM | POA: Diagnosis not present

## 2018-01-20 DIAGNOSIS — M5412 Radiculopathy, cervical region: Secondary | ICD-10-CM | POA: Diagnosis not present

## 2018-01-23 DIAGNOSIS — D472 Monoclonal gammopathy: Secondary | ICD-10-CM | POA: Diagnosis not present

## 2018-01-23 DIAGNOSIS — E611 Iron deficiency: Secondary | ICD-10-CM | POA: Diagnosis not present

## 2018-02-05 DIAGNOSIS — H35351 Cystoid macular degeneration, right eye: Secondary | ICD-10-CM | POA: Diagnosis not present

## 2018-02-28 ENCOUNTER — Encounter: Payer: Self-pay | Admitting: Neurology

## 2018-02-28 DIAGNOSIS — R5383 Other fatigue: Secondary | ICD-10-CM | POA: Diagnosis not present

## 2018-02-28 DIAGNOSIS — R251 Tremor, unspecified: Secondary | ICD-10-CM | POA: Diagnosis not present

## 2018-02-28 DIAGNOSIS — R2689 Other abnormalities of gait and mobility: Secondary | ICD-10-CM | POA: Diagnosis not present

## 2018-02-28 DIAGNOSIS — Z6826 Body mass index (BMI) 26.0-26.9, adult: Secondary | ICD-10-CM | POA: Diagnosis not present

## 2018-03-04 ENCOUNTER — Other Ambulatory Visit: Payer: Self-pay | Admitting: Internal Medicine

## 2018-03-07 ENCOUNTER — Other Ambulatory Visit: Payer: Self-pay | Admitting: Internal Medicine

## 2018-03-07 DIAGNOSIS — R2681 Unsteadiness on feet: Secondary | ICD-10-CM

## 2018-03-07 DIAGNOSIS — R5383 Other fatigue: Secondary | ICD-10-CM

## 2018-03-07 DIAGNOSIS — R251 Tremor, unspecified: Secondary | ICD-10-CM

## 2018-03-07 NOTE — Progress Notes (Signed)
Subjective:   Reginald Ford was seen in consultation in the movement disorder clinic at the request of [name], MD.  The evaluation is for tremor.  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  Tremors when doing yoga have increased.  Tremor is worse in the right hand.  He notes tremor with posture or intention but not necessarily at rest.  If he has support in the arm, it is better.   There is no family hx of tremor.  Also thinks that balance is not as good as in the past.  Has always done yoga but notices now that when bends over, he isn't as stable as previously.  No associated falls.  R foot is numb but that is since back surgery.  Tremor characteristics:  Affected by caffeine:  Unknown (drinks 2-3 cups of coffee per day) Affected by alcohol: doesn't drink EtOH Affected by stress:  No. Affected by fatigue:  Yes.  , "its possible."  Spills soup if on spoon:  No. Spills glass of liquid if full:  No. Affects ADL's (tying shoes, brushing teeth, etc):  No.  Current/Previously tried tremor medications: n/a  Current medications that may exacerbate tremor:  Prednisone (he doesn't know if this is influencing tremor)  Outside reports reviewed: historical medical records, lab reports, office notes and radiology reports.  Numerous records are reviewed from current neurologist at [facility].  Patient is seen there for seropositive myasthenia gravis.  He was last seen there on Nov 27, 2017.  Patient's symptoms began in 2009 with diplopia and ptosis.  He was treated with Mestinon alone until 2012 when he developed shoulder weakness.  Steroids were being avoided initially because he had diverticulitis requiring surgery in 2013.  Following recovery from that, the patient did well with Mestinon, 2 tablets 4 times per day and prednisone 2.5 mg every other day.  Last note from  neurology in May, 2018 indicates that they were going to try to taper the pyridostigmine and decrease his prednisone from 3 mg daily to 2 mg daily.  He reports that he has been a flare up since June and has been experiencing intermittent diplopia.  He is currently on mestinon 60 mg, 6-7 times per day and 5 mg of prednisone per day  MRI brain was recently ordered by primary care.  This was done on 03/09/18.  I personally reviewed it.  This was done without gad.  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  Allergies  Allergen Reactions  . Aminoglycosides Other (See Comments)    Medication may cause weakness from myasthenia gravis  . Beta Adrenergic Blockers Other (See Comments)    Medication may cause weakness from myasthenia gravis  . Botulinum Toxins Other (See Comments)    Medication may cause weakness from myasthenia gravis  . Calcium Channel Blockers Other (See Comments)    Medication may cause weakness from myasthenia gravis  . Curare [Tubocurarine] Other (See Comments)    Medication may cause weakness from myasthenia gravis  . Interferons Other (See Comments)    Medication may cause weakness from myasthenia gravis  . Iodinated Diagnostic Agents Other (See Comments)    Medication may cause weakness from myasthenia gravis  . Lithium Other (See Comments)    Medication may cause Weakness from myasthenia gravis  .  Macrolides And Ketolides Other (See Comments)    Medication may cause weakness from myastenia gravis  . Other Other (See Comments)    Magnesium salts, Fluoroquinolones  . Penicillamine Other (See Comments)    Medication may cause weakness from myasthenia gravis  . Quinine Derivatives Other (See Comments)    Medication may cause weakness from myastenia gravis  . Statins Other (See Comments)    Medication may cause weakness from myasthenia gravis    Outpatient Encounter Medications as of 03/11/2018  Medication Sig  . acetaminophen  (TYLENOL) 500 MG tablet Take 1,000 mg by mouth every 6 (six) hours as needed (for pain.).  . Alpha-Lipoic Acid 300 MG TABS Take 300 mg by mouth daily.  . B Complex Vitamins (B COMPLEX 50 PO) Take 1 tablet by mouth 2 (two) times daily.  Marland Kitchen CALCIUM CITRATE PO Take 1,000 mg by mouth 2 (two) times daily.  . Cholecalciferol (VITAMIN D3) 2000 units TABS Take 2,000 Units by mouth 2 (two) times daily.  . Coenzyme Q10 (COQ10) 200 MG CAPS Take 200 mg by mouth daily.  . Digestive Enzymes (DIGESTIVE ENZYME PO) Take 1 capsule by mouth 2 (two) times daily.  . Magnesium Bisglycinate (MAG GLYCINATE PO) Take 400 mg by mouth 2 (two) times daily.  . Multiple Vitamin (MULTIVITAMIN WITH MINERALS) TABS tablet Take 1 tablet by mouth daily.  Marland Kitchen omeprazole (PRILOSEC) 20 MG capsule Take 20 mg by mouth daily before breakfast.   . predniSONE (DELTASONE) 10 MG tablet Take 5-20 mg by mouth See admin instructions. Typically takes 5 mg by mouth daily but is taking 20 mg by mouth daily for  a week before and after surgery  . Probiotic Product (PROBIOTIC PO) Take 1 capsule by mouth 2 (two) times daily.  Marland Kitchen pyridostigmine (MESTINON) 60 MG tablet Take 120 mg by mouth 3 (three) times daily. Morning, Lunch, & supper  . Saw Palmetto, Serenoa repens, 320 MG CAPS Take 320 mg by mouth daily.  . vitamin C (ASCORBIC ACID) 500 MG tablet Take 500 mg by mouth daily.  . [DISCONTINUED] diclofenac sodium (VOLTAREN) 1 % GEL Apply 2-4 g topically 4 (four) times daily as needed (for pain.).  . [DISCONTINUED] HYDROcodone-acetaminophen (NORCO/VICODIN) 5-325 MG tablet Take 1-2 tablets by mouth every 6 (six) hours as needed for moderate pain ((score 4 to 6)).   No facility-administered encounter medications on file as of 03/11/2018.     Past Medical History:  Diagnosis Date  . Anemia    hx of iron infusions - 01/2014 and 01/2015  . Arthritis    shoulder  . Barrett's esophagus   . Cancer ([place])    basal cell cancer  . Cramps, muscle, general    if  he gets dehyrated  . Diverticulitis   . History of hiatal hernia   . Kidney function test abnormal    ELEVATED LEVELS  WATCHED BY PCP  . Myasthenia gravis ([place])    "ocular" myasthenia gravis  . Pneumonia    as a child. FALL 2018      Past Surgical History:  Procedure Laterality Date  . ANTERIOR CERVICAL DECOMP/DISCECTOMY FUSION N/A 09/05/2017   Procedure: ANTERIOR CERVICAL DECOMPRESSION FUSION CERVICAL FOUR-FIVE,CERVICAL FIVE-SIX.;  Surgeon: Eustace Moore, MD;  Location: [facility];  Service: Neurosurgery;  Laterality: N/A;  anterior  . CATARACT EXTRACTION, BILATERAL    . COLON SURGERY    . COLONOSCOPY    . COLOSTOMY  02/2012   for Diverticulitis  . COLOSTOMY REVERSAL  05/2012  .  LUMBAR LAMINECTOMY/DECOMPRESSION MICRODISCECTOMY Bilateral 08/18/2015   Procedure: Laminectomy and Foraminotomy - L4-L5 - L5-S1 - bilateral;  Surgeon: Eustace Moore, MD;  Location: [facility];  Service: Neurosurgery;  Laterality: Bilateral;  . VASECTOMY      Social History   Socioeconomic History  . Marital status: Married    Spouse name: Not on file  . Number of children: Not on file  . Years of education: Not on file  . Highest education level: Not on file  Occupational History  . Occupation: retired    Comment: Art gallery manager  Social Needs  . Financial resource strain: Not on file  . Food insecurity:    Worry: Not on file    Inability: Not on file  . Transportation needs:    Medical: Not on file    Non-medical: Not on file  Tobacco Use  . Smoking status: Never Smoker  . Smokeless tobacco: Never Used  Substance and Sexual Activity  . Alcohol use: No  . Drug use: No  . Sexual activity: Not on file  Lifestyle  . Physical activity:    Days per week: Not on file    Minutes per session: Not on file  . Stress: Not on file  Relationships  . Social connections:    Talks on phone: Not on file    Gets together: Not on file    Attends religious service: Not on file    Active member of club  or organization: Not on file    Attends meetings of clubs or organizations: Not on file    Relationship status: Not on file  . Intimate partner violence:    Fear of current or ex partner: Not on file    Emotionally abused: Not on file    Physically abused: Not on file    Forced sexual activity: Not on file  Other Topics Concern  . Not on file  Social History Narrative  . Not on file    Family Status  Relation Name Status  . Mother  Deceased  . Father  Deceased  . Sister  Deceased  . Brother  Deceased  . Daughter  Alive  . Son  Alive    Review of Systems Review of Systems  Constitutional: Negative.   HENT: Negative.   Eyes: Positive for double vision.  Respiratory: Negative.   Gastrointestinal: Negative.   Genitourinary: Negative.   Musculoskeletal: Negative.   Skin: Negative.   Neurological: Positive for tremors.  Endo/Heme/Allergies: Negative.   Psychiatric/Behavioral: The patient is nervous/anxious (Admits to anxiety.  Has been considering seeing psychiatry for medical treatment.).      Objective:   VITALS:   Vitals:   03/11/18 0838  BP: 136/80  Pulse: 66  SpO2: 94%  Weight: 171 lb (77.6 kg)  Height: 5\' 7"  (1.702 m)   Gen:  Appears stated age and in NAD. HEENT:  Normocephalic, atraumatic. The mucous membranes are moist. The superficial temporal arteries are without ropiness or tenderness. Cardiovascular: Regular rate and rhythm. Lungs: Clear to auscultation bilaterally. Neck: There are no carotid bruits noted bilaterally.  NEUROLOGICAL:  Orientation:  The patient is alert and oriented x 3.  Recent and remote memory are intact.  Attention span and concentration are normal.  Able to name objects and repeat without trouble.  Fund of knowledge is appropriate Cranial nerves: There is good facial symmetry. The pupils are equal round and reactive to light bilaterally. Fundoscopic exam reveals clear disc margins bilaterally. Extraocular muscles are intact and  visual fields are full to confrontational testing. Speech is fluent and clear. Soft palate rises symmetrically and there is no tongue deviation. Hearing is intact to conversational tone. Tone: Tone is good throughout. Sensation: Sensation is intact to light touch and pinprick throughout (facial, trunk, extremities). Vibration is intact at the bilateral big toe but decreased.  Pin is decreased in stocking distrubution, moreso on the right.   There is no extinction with double simultaneous stimulation. There is no sensory dermatomal level identified. Coordination:  The patient has no dysdiadichokinesia or dysmetria. Motor: Strength is 5/5 in the bilateral upper and lower extremities.  Shoulder shrug is equal bilaterally.  There is no pronator drift.  There are no fasciculations noted. DTR's: Deep tendon reflexes are 2/4 at the bilateral biceps, triceps, brachioradialis, left patella and trace at bilateral achilles.  DTR's are 1/4 at the right patella.  Plantar responses are downgoing bilaterally. Gait and Station: The patient is able to ambulate without difficulty.  He does have mild difficulty ambulating in a tandem fashion.  He is able to walk on heels and toes.  MOVEMENT EXAM: Tremor:  There is mild tremor of the outstretched hands bilaterally.  He has significant trouble with Archimedes spirals on the right when the arm is unsupported.  The patient is  able to pour water from one glass to another without spilling it but tremor is noted when water is in the L hand.    Labs: Lab work is received by primary care.  Last lab work there was April 17, 2017.  White blood cells were 8.2, hemoglobin 15.4, and platelets 214.  Sodium was 142, potassium 5.3, chloride 107, CO2 26, BUN 22, creatinine 1.3.  Total cholesterol is 150.  LDL 90.  TSH 1.88.  Lab work is reviewed from [facility].  Lab work was done Nov 27, 2017.  B12 was 953.  TSH 1.046.  Patient also brought lab work from oncology, which he sees yearly  in [place].  These are dated January 23, 2018.  White blood cells were 8.8, hemoglobin 14.8, hematocrit 45 and platelets 218.  Sodium was 138, potassium 4.3, chloride 108, CO2 25, BUN 25, creatinine 1.2.  Iron 116, ferritin 65, transferrin 194.  Free kappa light chains in the serum were 16.3 (normal).  Free lambda light chains were 11.09 in the serum (normal).     Assessment/Plan:   1.  Essential Tremor.  -This is evidenced by the symmetrical nature and longstanding hx of gradually getting worse.  We discussed nature and pathophysiology.  We discussed that his prednisone could be contributing.  Anxiety could also be a contributing factor.  Reassurance was provided that he did not have Parkinson's disease, as he worried about that.  We discussed that this can continue to gradually get worse with time.  We discussed that some medications can worsen this, as can caffeine use.  We discussed medication therapy as well as surgical therapy.  Ultimately, the patient decided to hold off on any medical treatment.  We did discuss things such as weighted gloves.  We also discussed liftware.   2.  Myasthenia Gravis  -seen and managed at [facility].   He is considering transferring care here and asked me about that.  We can certainly get him an appointment with Dr. Posey Pronto if he would like to do that, but is being well cared for at [facility].  3.  Gait change  -Patient noticing very minor balance issues.  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  He is already seeing neuromuscular and has an appointment there next week, so I told him he could discuss this with them.  We did talk about  balance exercises, and he is faithfully doing yoga.  He is seeing oncology for what sounded like a possible kappa light chains, but most recent lab work from oncology did not show any free kappa light chains.  4.  Follow up on as needed basis.  Much greater than 50% of this visit was spent in counseling and coordinating  care.  Total face to face time:  45 min  CC:  [name], MD

## 2018-03-09 ENCOUNTER — Ambulatory Visit
Admission: RE | Admit: 2018-03-09 | Discharge: 2018-03-09 | Disposition: A | Discharge status: Home / Self Care | Payer: Medicare Other | Source: Ambulatory Visit | Attending: Internal Medicine | Admitting: Internal Medicine

## 2018-03-09 DIAGNOSIS — R5383 Other fatigue: Secondary | ICD-10-CM

## 2018-03-09 DIAGNOSIS — R2681 Unsteadiness on feet: Secondary | ICD-10-CM

## 2018-03-09 DIAGNOSIS — R251 Tremor, unspecified: Secondary | ICD-10-CM | POA: Diagnosis not present

## 2018-03-11 ENCOUNTER — Other Ambulatory Visit: Payer: Self-pay | Admitting: Internal Medicine

## 2018-03-11 ENCOUNTER — Ambulatory Visit (INDEPENDENT_AMBULATORY_CARE_PROVIDER_SITE_OTHER): Payer: Medicare Other | Admitting: Neurology

## 2018-03-11 ENCOUNTER — Encounter: Payer: Self-pay | Admitting: Neurology

## 2018-03-11 VITALS — BP 136/80 | HR 66 | Ht 67.0 in | Wt 171.0 lb

## 2018-03-11 DIAGNOSIS — G250 Essential tremor: Secondary | ICD-10-CM | POA: Diagnosis not present

## 2018-03-11 DIAGNOSIS — R2689 Other abnormalities of gait and mobility: Secondary | ICD-10-CM

## 2018-03-11 DIAGNOSIS — G609 Hereditary and idiopathic neuropathy, unspecified: Secondary | ICD-10-CM

## 2018-03-11 DIAGNOSIS — G7000 Myasthenia gravis without (acute) exacerbation: Secondary | ICD-10-CM

## 2018-03-11 DIAGNOSIS — R251 Tremor, unspecified: Secondary | ICD-10-CM

## 2018-03-20 DIAGNOSIS — Z79899 Other long term (current) drug therapy: Secondary | ICD-10-CM | POA: Diagnosis not present

## 2018-03-20 DIAGNOSIS — G7000 Myasthenia gravis without (acute) exacerbation: Secondary | ICD-10-CM | POA: Diagnosis not present

## 2018-03-20 DIAGNOSIS — Z7952 Long term (current) use of systemic steroids: Secondary | ICD-10-CM | POA: Diagnosis not present

## 2018-04-21 DIAGNOSIS — Z79899 Other long term (current) drug therapy: Secondary | ICD-10-CM | POA: Diagnosis not present

## 2018-04-21 DIAGNOSIS — R82998 Other abnormal findings in urine: Secondary | ICD-10-CM | POA: Diagnosis not present

## 2018-04-21 DIAGNOSIS — N183 Chronic kidney disease, stage 3 (moderate): Secondary | ICD-10-CM | POA: Diagnosis not present

## 2018-04-21 DIAGNOSIS — Z125 Encounter for screening for malignant neoplasm of prostate: Secondary | ICD-10-CM | POA: Diagnosis not present

## 2018-05-07 DIAGNOSIS — N183 Chronic kidney disease, stage 3 (moderate): Secondary | ICD-10-CM | POA: Diagnosis not present

## 2018-05-07 DIAGNOSIS — Z23 Encounter for immunization: Secondary | ICD-10-CM | POA: Diagnosis not present

## 2018-05-07 DIAGNOSIS — R0789 Other chest pain: Secondary | ICD-10-CM | POA: Diagnosis not present

## 2018-05-07 DIAGNOSIS — G7000 Myasthenia gravis without (acute) exacerbation: Secondary | ICD-10-CM | POA: Diagnosis not present

## 2018-05-07 DIAGNOSIS — Z1389 Encounter for screening for other disorder: Secondary | ICD-10-CM | POA: Diagnosis not present

## 2018-05-07 DIAGNOSIS — R251 Tremor, unspecified: Secondary | ICD-10-CM | POA: Diagnosis not present

## 2018-05-07 DIAGNOSIS — R2689 Other abnormalities of gait and mobility: Secondary | ICD-10-CM | POA: Diagnosis not present

## 2018-05-07 DIAGNOSIS — Z0000 Encounter for general adult medical examination without abnormal findings: Secondary | ICD-10-CM | POA: Diagnosis not present

## 2018-05-07 DIAGNOSIS — Z6826 Body mass index (BMI) 26.0-26.9, adult: Secondary | ICD-10-CM | POA: Diagnosis not present

## 2018-05-07 DIAGNOSIS — D1803 Hemangioma of intra-abdominal structures: Secondary | ICD-10-CM | POA: Diagnosis not present

## 2018-05-07 DIAGNOSIS — R972 Elevated prostate specific antigen [PSA]: Secondary | ICD-10-CM | POA: Diagnosis not present

## 2018-05-07 DIAGNOSIS — N400 Enlarged prostate without lower urinary tract symptoms: Secondary | ICD-10-CM | POA: Diagnosis not present

## 2018-05-07 DIAGNOSIS — G4709 Other insomnia: Secondary | ICD-10-CM | POA: Diagnosis not present

## 2018-05-08 DIAGNOSIS — R972 Elevated prostate specific antigen [PSA]: Secondary | ICD-10-CM | POA: Diagnosis not present

## 2018-06-16 DIAGNOSIS — K210 Gastro-esophageal reflux disease with esophagitis: Secondary | ICD-10-CM | POA: Diagnosis not present

## 2018-06-16 DIAGNOSIS — K2270 Barrett's esophagus without dysplasia: Secondary | ICD-10-CM | POA: Diagnosis not present

## 2018-06-17 DIAGNOSIS — B353 Tinea pedis: Secondary | ICD-10-CM | POA: Diagnosis not present

## 2018-06-17 DIAGNOSIS — D692 Other nonthrombocytopenic purpura: Secondary | ICD-10-CM | POA: Diagnosis not present

## 2018-06-17 DIAGNOSIS — L700 Acne vulgaris: Secondary | ICD-10-CM | POA: Diagnosis not present

## 2018-06-17 DIAGNOSIS — L720 Epidermal cyst: Secondary | ICD-10-CM | POA: Diagnosis not present

## 2018-06-17 DIAGNOSIS — D1801 Hemangioma of skin and subcutaneous tissue: Secondary | ICD-10-CM | POA: Diagnosis not present

## 2018-06-17 DIAGNOSIS — I788 Other diseases of capillaries: Secondary | ICD-10-CM | POA: Diagnosis not present

## 2018-06-17 DIAGNOSIS — B351 Tinea unguium: Secondary | ICD-10-CM | POA: Diagnosis not present

## 2018-06-17 DIAGNOSIS — D225 Melanocytic nevi of trunk: Secondary | ICD-10-CM | POA: Diagnosis not present

## 2018-06-17 DIAGNOSIS — L821 Other seborrheic keratosis: Secondary | ICD-10-CM | POA: Diagnosis not present

## 2018-06-18 DIAGNOSIS — M48061 Spinal stenosis, lumbar region without neurogenic claudication: Secondary | ICD-10-CM | POA: Diagnosis not present

## 2018-06-24 DIAGNOSIS — M48061 Spinal stenosis, lumbar region without neurogenic claudication: Secondary | ICD-10-CM | POA: Diagnosis not present

## 2018-07-01 DIAGNOSIS — M48061 Spinal stenosis, lumbar region without neurogenic claudication: Secondary | ICD-10-CM | POA: Diagnosis not present

## 2018-07-03 DIAGNOSIS — G7000 Myasthenia gravis without (acute) exacerbation: Secondary | ICD-10-CM | POA: Diagnosis not present

## 2018-07-03 DIAGNOSIS — Z7952 Long term (current) use of systemic steroids: Secondary | ICD-10-CM | POA: Diagnosis not present

## 2018-07-03 DIAGNOSIS — Z79899 Other long term (current) drug therapy: Secondary | ICD-10-CM | POA: Diagnosis not present

## 2018-07-03 DIAGNOSIS — H532 Diplopia: Secondary | ICD-10-CM | POA: Diagnosis not present

## 2018-09-03 DIAGNOSIS — Z961 Presence of intraocular lens: Secondary | ICD-10-CM | POA: Diagnosis not present

## 2018-09-22 DIAGNOSIS — Z79899 Other long term (current) drug therapy: Secondary | ICD-10-CM | POA: Diagnosis not present

## 2018-09-22 DIAGNOSIS — H532 Diplopia: Secondary | ICD-10-CM | POA: Diagnosis not present

## 2018-09-22 DIAGNOSIS — Z7952 Long term (current) use of systemic steroids: Secondary | ICD-10-CM | POA: Diagnosis not present

## 2018-09-22 DIAGNOSIS — G7000 Myasthenia gravis without (acute) exacerbation: Secondary | ICD-10-CM | POA: Diagnosis not present

## 2018-09-22 DIAGNOSIS — G250 Essential tremor: Secondary | ICD-10-CM | POA: Diagnosis not present

## 2018-09-28 IMAGING — RF DG C-ARM 61-120 MIN
1 series · 2 of 2 positions shown · non-contrast
Comparison: Cervical spine MRI August 10, 2017

FLUOROSCOPY TIME:  0 minutes 10 seconds; 2 acquired images

CLINICAL DATA: Anterior fusion

EXAM:
CERVICAL SPINE - 1 VIEW; DG C-ARM 61-120 MIN

[Series 1: run · 2 of 2 slices shown]
[im 1/2]
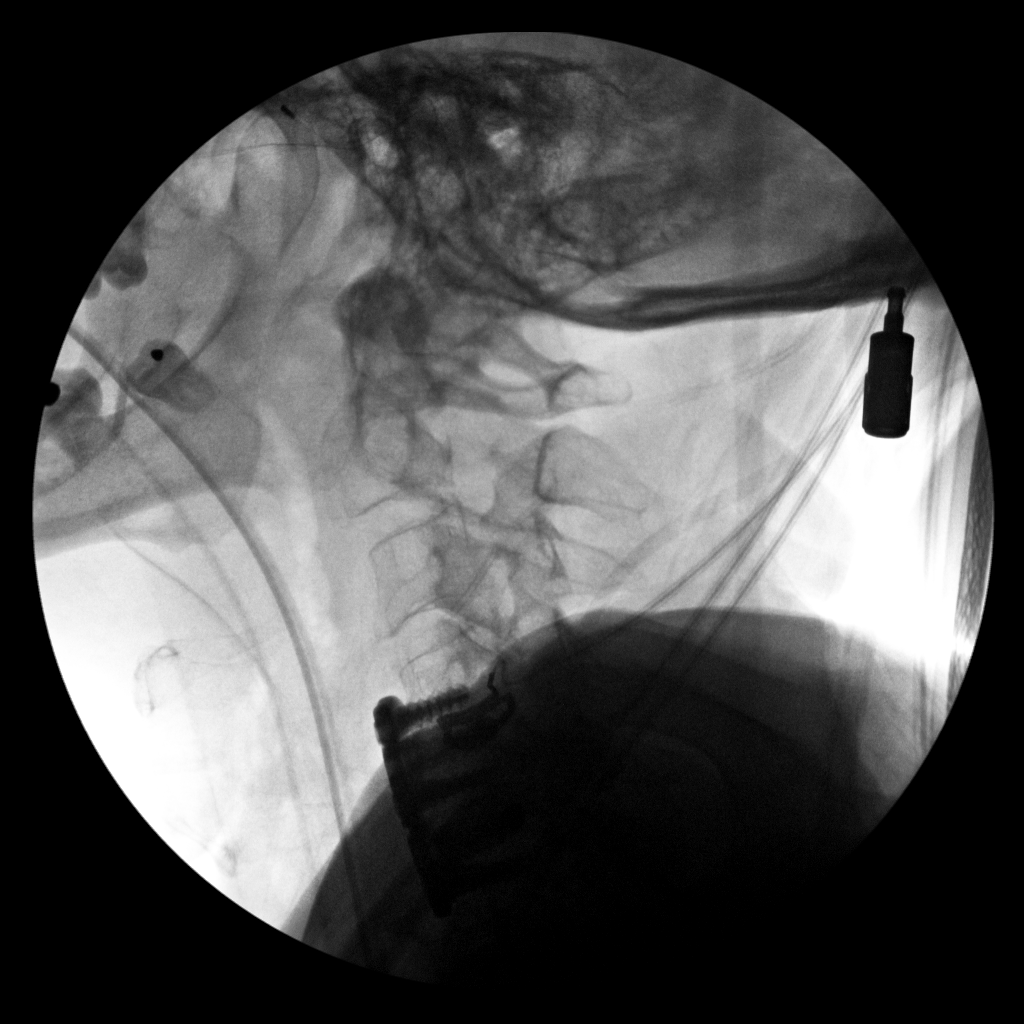
[im 2/2]
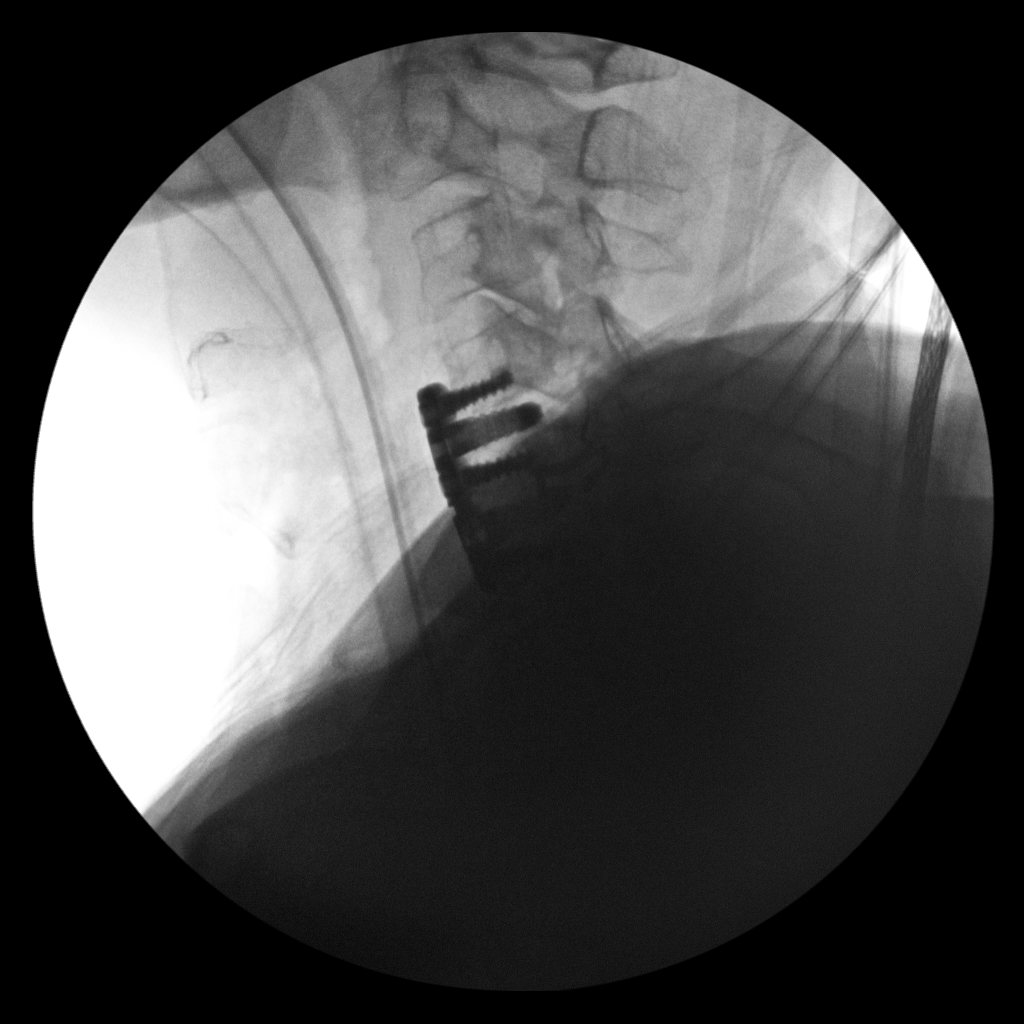

[2 of 2 positions shown; findings below may reference images not displayed]

FINDINGS: Cross-table lateral images show anterior screw and plate fixation
from C4-C6 with support hardware intact on lateral imaging. No
fracture or spondylolisthesis is evident. Visualized disc spaces
appear intact.
IMPRESSION: Anterior fusion from C4-C6 with support hardware appearing intact on
single view. No fracture or spondylolisthesis evident.

## 2018-11-04 DIAGNOSIS — R972 Elevated prostate specific antigen [PSA]: Secondary | ICD-10-CM | POA: Diagnosis not present

## 2018-11-11 DIAGNOSIS — N5201 Erectile dysfunction due to arterial insufficiency: Secondary | ICD-10-CM | POA: Diagnosis not present

## 2018-11-11 DIAGNOSIS — R972 Elevated prostate specific antigen [PSA]: Secondary | ICD-10-CM | POA: Diagnosis not present

## 2018-11-11 DIAGNOSIS — N400 Enlarged prostate without lower urinary tract symptoms: Secondary | ICD-10-CM | POA: Diagnosis not present

## 2018-11-20 DIAGNOSIS — R972 Elevated prostate specific antigen [PSA]: Secondary | ICD-10-CM | POA: Diagnosis not present

## 2018-11-20 DIAGNOSIS — G4700 Insomnia, unspecified: Secondary | ICD-10-CM | POA: Diagnosis not present

## 2018-11-20 DIAGNOSIS — K2270 Barrett's esophagus without dysplasia: Secondary | ICD-10-CM | POA: Diagnosis not present

## 2018-11-20 DIAGNOSIS — M792 Neuralgia and neuritis, unspecified: Secondary | ICD-10-CM | POA: Diagnosis not present

## 2018-11-20 DIAGNOSIS — N400 Enlarged prostate without lower urinary tract symptoms: Secondary | ICD-10-CM | POA: Diagnosis not present

## 2018-11-20 DIAGNOSIS — G7000 Myasthenia gravis without (acute) exacerbation: Secondary | ICD-10-CM | POA: Diagnosis not present

## 2019-01-06 DIAGNOSIS — L723 Sebaceous cyst: Secondary | ICD-10-CM | POA: Diagnosis not present

## 2019-01-06 DIAGNOSIS — Z85828 Personal history of other malignant neoplasm of skin: Secondary | ICD-10-CM | POA: Diagnosis not present

## 2019-01-14 DIAGNOSIS — G7000 Myasthenia gravis without (acute) exacerbation: Secondary | ICD-10-CM | POA: Diagnosis not present

## 2019-01-14 DIAGNOSIS — D649 Anemia, unspecified: Secondary | ICD-10-CM | POA: Diagnosis not present

## 2019-01-14 DIAGNOSIS — R7989 Other specified abnormal findings of blood chemistry: Secondary | ICD-10-CM | POA: Diagnosis not present

## 2019-01-20 DIAGNOSIS — D485 Neoplasm of uncertain behavior of skin: Secondary | ICD-10-CM | POA: Diagnosis not present

## 2019-01-20 DIAGNOSIS — Z85828 Personal history of other malignant neoplasm of skin: Secondary | ICD-10-CM | POA: Diagnosis not present

## 2019-01-20 DIAGNOSIS — D1801 Hemangioma of skin and subcutaneous tissue: Secondary | ICD-10-CM | POA: Diagnosis not present

## 2019-01-22 DIAGNOSIS — D693 Immune thrombocytopenic purpura: Secondary | ICD-10-CM | POA: Diagnosis not present

## 2019-01-22 DIAGNOSIS — D509 Iron deficiency anemia, unspecified: Secondary | ICD-10-CM | POA: Diagnosis not present

## 2019-01-22 DIAGNOSIS — R768 Other specified abnormal immunological findings in serum: Secondary | ICD-10-CM | POA: Diagnosis not present

## 2019-01-22 DIAGNOSIS — D472 Monoclonal gammopathy: Secondary | ICD-10-CM | POA: Diagnosis not present

## 2019-01-29 DIAGNOSIS — G7000 Myasthenia gravis without (acute) exacerbation: Secondary | ICD-10-CM | POA: Diagnosis not present

## 2019-03-06 DIAGNOSIS — K2270 Barrett's esophagus without dysplasia: Secondary | ICD-10-CM | POA: Diagnosis not present

## 2019-03-06 DIAGNOSIS — R1310 Dysphagia, unspecified: Secondary | ICD-10-CM | POA: Diagnosis not present

## 2019-03-06 DIAGNOSIS — G7000 Myasthenia gravis without (acute) exacerbation: Secondary | ICD-10-CM | POA: Diagnosis not present

## 2019-03-06 DIAGNOSIS — R682 Dry mouth, unspecified: Secondary | ICD-10-CM | POA: Diagnosis not present

## 2019-03-13 DIAGNOSIS — R682 Dry mouth, unspecified: Secondary | ICD-10-CM | POA: Diagnosis not present

## 2019-04-20 DIAGNOSIS — M19042 Primary osteoarthritis, left hand: Secondary | ICD-10-CM | POA: Diagnosis not present

## 2019-05-01 DIAGNOSIS — Z23 Encounter for immunization: Secondary | ICD-10-CM | POA: Diagnosis not present

## 2019-05-08 DIAGNOSIS — D649 Anemia, unspecified: Secondary | ICD-10-CM | POA: Diagnosis not present

## 2019-05-08 DIAGNOSIS — Z125 Encounter for screening for malignant neoplasm of prostate: Secondary | ICD-10-CM | POA: Diagnosis not present

## 2019-05-08 DIAGNOSIS — N1831 Chronic kidney disease, stage 3a: Secondary | ICD-10-CM | POA: Diagnosis not present

## 2019-05-11 DIAGNOSIS — R82998 Other abnormal findings in urine: Secondary | ICD-10-CM | POA: Diagnosis not present

## 2019-05-14 DIAGNOSIS — Z1212 Encounter for screening for malignant neoplasm of rectum: Secondary | ICD-10-CM | POA: Diagnosis not present

## 2019-05-15 DIAGNOSIS — Z1331 Encounter for screening for depression: Secondary | ICD-10-CM | POA: Diagnosis not present

## 2019-05-15 DIAGNOSIS — R972 Elevated prostate specific antigen [PSA]: Secondary | ICD-10-CM | POA: Diagnosis not present

## 2019-05-15 DIAGNOSIS — Z0000 Encounter for general adult medical examination without abnormal findings: Secondary | ICD-10-CM | POA: Diagnosis not present

## 2019-05-15 DIAGNOSIS — G4700 Insomnia, unspecified: Secondary | ICD-10-CM | POA: Diagnosis not present

## 2019-05-15 DIAGNOSIS — N183 Chronic kidney disease, stage 3 unspecified: Secondary | ICD-10-CM | POA: Diagnosis not present

## 2019-05-15 DIAGNOSIS — R682 Dry mouth, unspecified: Secondary | ICD-10-CM | POA: Diagnosis not present

## 2019-05-15 DIAGNOSIS — N400 Enlarged prostate without lower urinary tract symptoms: Secondary | ICD-10-CM | POA: Diagnosis not present

## 2019-05-15 DIAGNOSIS — D649 Anemia, unspecified: Secondary | ICD-10-CM | POA: Diagnosis not present

## 2019-05-15 DIAGNOSIS — G7000 Myasthenia gravis without (acute) exacerbation: Secondary | ICD-10-CM | POA: Diagnosis not present

## 2019-05-15 DIAGNOSIS — M1990 Unspecified osteoarthritis, unspecified site: Secondary | ICD-10-CM | POA: Diagnosis not present

## 2019-05-15 DIAGNOSIS — K2270 Barrett's esophagus without dysplasia: Secondary | ICD-10-CM | POA: Diagnosis not present

## 2019-05-29 ENCOUNTER — Other Ambulatory Visit: Payer: Self-pay | Admitting: Internal Medicine

## 2019-05-29 DIAGNOSIS — K7689 Other specified diseases of liver: Secondary | ICD-10-CM

## 2019-06-03 DIAGNOSIS — L905 Scar conditions and fibrosis of skin: Secondary | ICD-10-CM | POA: Diagnosis not present

## 2019-06-03 DIAGNOSIS — B353 Tinea pedis: Secondary | ICD-10-CM | POA: Diagnosis not present

## 2019-06-03 DIAGNOSIS — Z85828 Personal history of other malignant neoplasm of skin: Secondary | ICD-10-CM | POA: Diagnosis not present

## 2019-06-03 DIAGNOSIS — L821 Other seborrheic keratosis: Secondary | ICD-10-CM | POA: Diagnosis not present

## 2019-06-03 DIAGNOSIS — D1801 Hemangioma of skin and subcutaneous tissue: Secondary | ICD-10-CM | POA: Diagnosis not present

## 2019-06-03 DIAGNOSIS — D692 Other nonthrombocytopenic purpura: Secondary | ICD-10-CM | POA: Diagnosis not present

## 2019-06-05 ENCOUNTER — Ambulatory Visit
Admission: RE | Admit: 2019-06-05 | Discharge: 2019-06-05 | Disposition: A | Discharge status: Home / Self Care | Payer: Medicare Other | Source: Ambulatory Visit | Attending: Internal Medicine | Admitting: Internal Medicine

## 2019-06-05 DIAGNOSIS — K7689 Other specified diseases of liver: Secondary | ICD-10-CM

## 2019-06-05 DIAGNOSIS — D1809 Hemangioma of other sites: Secondary | ICD-10-CM | POA: Diagnosis not present

## 2019-06-11 DIAGNOSIS — G7000 Myasthenia gravis without (acute) exacerbation: Secondary | ICD-10-CM | POA: Diagnosis not present

## 2019-06-15 DIAGNOSIS — M19042 Primary osteoarthritis, left hand: Secondary | ICD-10-CM | POA: Diagnosis not present

## 2019-08-11 DIAGNOSIS — R972 Elevated prostate specific antigen [PSA]: Secondary | ICD-10-CM | POA: Diagnosis not present

## 2019-08-20 ENCOUNTER — Ambulatory Visit: Payer: Medicare Other | Attending: Internal Medicine

## 2019-08-20 DIAGNOSIS — Z23 Encounter for immunization: Secondary | ICD-10-CM | POA: Insufficient documentation

## 2019-08-20 NOTE — Progress Notes (Signed)
   Covid-19 Vaccination Clinic  Name:  Reginald Ford    MRN: [id number] DOB: 12-05-1940  08/20/2019  [name] was observed post Covid-19 immunization for 15 minutes without incidence. He was provided with Vaccine Information Sheet and instruction to access the V-Safe system.   [name] was instructed to call 911 with any severe reactions post vaccine: Marland Kitchen Difficulty breathing  . Swelling of your face and throat  . A fast heartbeat  . A bad rash all over your body  . Dizziness and weakness    Immunizations Administered    Name Date Dose VIS Date Route   Pfizer COVID-19 Vaccine 08/20/2019 11:49 AM 0.3 mL 07/10/2019 Intramuscular   Manufacturer: [place]   Lot: [id number]   [place]: [id number]

## 2019-09-10 ENCOUNTER — Ambulatory Visit: Payer: Medicare Other | Attending: Internal Medicine

## 2019-09-10 DIAGNOSIS — Z23 Encounter for immunization: Secondary | ICD-10-CM | POA: Insufficient documentation

## 2019-09-10 NOTE — Progress Notes (Signed)
   Covid-19 Vaccination Clinic  Name:  Reginald Ford    MRN: [id number] DOB: 1940-12-10  09/10/2019  Mr. Reginald Ford was observed post Covid-19 immunization for 15 minutes without incidence. He was provided with Vaccine Information Sheet and instruction to access the V-Safe system.   Mr. Reginald Ford was instructed to call 911 with any severe reactions post vaccine: Marland Kitchen Difficulty breathing  . Swelling of your face and throat  . A fast heartbeat  . A bad rash all over your body  . Dizziness and weakness    Immunizations Administered    Name Date Dose VIS Date Route   Pfizer COVID-19 Vaccine 09/10/2019  8:58 AM 0.3 mL 07/10/2019 Intramuscular   Manufacturer: [place]   Lot: [id number]   [place]: [id number]

## 2019-09-21 DIAGNOSIS — R972 Elevated prostate specific antigen [PSA]: Secondary | ICD-10-CM | POA: Diagnosis not present

## 2019-09-23 DIAGNOSIS — R972 Elevated prostate specific antigen [PSA]: Secondary | ICD-10-CM | POA: Diagnosis not present

## 2019-09-23 DIAGNOSIS — G8929 Other chronic pain: Secondary | ICD-10-CM | POA: Diagnosis not present

## 2019-10-06 DIAGNOSIS — R972 Elevated prostate specific antigen [PSA]: Secondary | ICD-10-CM | POA: Diagnosis not present

## 2019-10-06 DIAGNOSIS — C61 Malignant neoplasm of prostate: Secondary | ICD-10-CM | POA: Diagnosis not present

## 2019-10-13 DIAGNOSIS — C61 Malignant neoplasm of prostate: Secondary | ICD-10-CM | POA: Diagnosis not present

## 2019-10-16 DIAGNOSIS — C61 Malignant neoplasm of prostate: Secondary | ICD-10-CM | POA: Diagnosis not present

## 2019-10-16 DIAGNOSIS — M79605 Pain in left leg: Secondary | ICD-10-CM | POA: Diagnosis not present

## 2019-10-30 DIAGNOSIS — M19042 Primary osteoarthritis, left hand: Secondary | ICD-10-CM | POA: Diagnosis not present

## 2019-11-09 DIAGNOSIS — H5213 Myopia, bilateral: Secondary | ICD-10-CM | POA: Diagnosis not present

## 2019-11-11 ENCOUNTER — Telehealth: Payer: Self-pay | Admitting: Genetic Counselor

## 2019-11-11 NOTE — Telephone Encounter (Signed)
Returned Reginald Ford's phone call regarding his interest in genetic counseling. He recently had a prostate biopsy revealing prostate cancer with a Gleason score of 3+3=6, and has a recent PSA of 6.5. Additionally, he has a family history of cancer that includes prostate cancer in his father, gallbladder cancer in his sister that metastasized, and lung cancer in his brother that metastasized to his brain. He is interested in genetic counseling and testing given his family history, and now personal history of cancer.   He has already been scheduled for a genetic counseling appointment on Monday. We discussed that it would be helpful if he is able to find out about any other family members (such as aunts/uncles, cousins, grandparents) who have had cancer, including what type of cancer they had and how old they were when it was diagnosed. He is not close with these extended family members so he does not currently have much information about their cancer history, but will try to learn more before his genetic counseling appointment. We discussed that recommendations for/insurance coverage of genetic testing will be largely determined based on his family history.   Also provided the option for Reginald Ford's genetic counseling appointment to be virtual through [facility]. He feels that this will be preferable since his wife will then be able to sit in on the appointment as well. We will send an email/text at the time of his visit.

## 2019-11-11 NOTE — Telephone Encounter (Signed)
Received a call from Reginald Ford who's wanting to schedule a genetic counseling appt due to a strong fhx of cancer. Pt has been scheduled to see [name] on 4/19 at 10am. Pt aware to arrive 15 minutes early.

## 2019-11-16 ENCOUNTER — Inpatient Hospital Stay: Payer: Medicare Other | Attending: Genetic Counselor | Admitting: Genetic Counselor

## 2019-11-16 ENCOUNTER — Inpatient Hospital Stay: Payer: Medicare Other

## 2019-11-16 DIAGNOSIS — Z8041 Family history of malignant neoplasm of ovary: Secondary | ICD-10-CM | POA: Diagnosis not present

## 2019-11-16 DIAGNOSIS — Z800 Family history of malignant neoplasm of digestive organs: Secondary | ICD-10-CM | POA: Diagnosis not present

## 2019-11-16 DIAGNOSIS — Z801 Family history of malignant neoplasm of trachea, bronchus and lung: Secondary | ICD-10-CM

## 2019-11-16 DIAGNOSIS — Z8042 Family history of malignant neoplasm of prostate: Secondary | ICD-10-CM

## 2019-11-16 DIAGNOSIS — Z8049 Family history of malignant neoplasm of other genital organs: Secondary | ICD-10-CM

## 2019-11-16 DIAGNOSIS — C61 Malignant neoplasm of prostate: Secondary | ICD-10-CM

## 2019-11-16 DIAGNOSIS — Z808 Family history of malignant neoplasm of other organs or systems: Secondary | ICD-10-CM

## 2019-11-17 ENCOUNTER — Other Ambulatory Visit: Payer: Self-pay

## 2019-11-17 ENCOUNTER — Inpatient Hospital Stay: Payer: Medicare Other

## 2019-11-17 DIAGNOSIS — Z8041 Family history of malignant neoplasm of ovary: Secondary | ICD-10-CM | POA: Diagnosis not present

## 2019-11-17 DIAGNOSIS — M79605 Pain in left leg: Secondary | ICD-10-CM | POA: Diagnosis not present

## 2019-11-17 DIAGNOSIS — C61 Malignant neoplasm of prostate: Secondary | ICD-10-CM | POA: Diagnosis not present

## 2019-11-17 DIAGNOSIS — Z8042 Family history of malignant neoplasm of prostate: Secondary | ICD-10-CM | POA: Diagnosis not present

## 2019-11-18 ENCOUNTER — Encounter: Payer: Self-pay | Admitting: Genetic Counselor

## 2019-11-18 DIAGNOSIS — R7989 Other specified abnormal findings of blood chemistry: Secondary | ICD-10-CM | POA: Diagnosis not present

## 2019-11-18 DIAGNOSIS — Z808 Family history of malignant neoplasm of other organs or systems: Secondary | ICD-10-CM | POA: Insufficient documentation

## 2019-11-18 DIAGNOSIS — Z8042 Family history of malignant neoplasm of prostate: Secondary | ICD-10-CM | POA: Insufficient documentation

## 2019-11-18 DIAGNOSIS — D649 Anemia, unspecified: Secondary | ICD-10-CM | POA: Diagnosis not present

## 2019-11-18 DIAGNOSIS — Z800 Family history of malignant neoplasm of digestive organs: Secondary | ICD-10-CM | POA: Insufficient documentation

## 2019-11-18 DIAGNOSIS — Z8049 Family history of malignant neoplasm of other genital organs: Secondary | ICD-10-CM | POA: Insufficient documentation

## 2019-11-18 DIAGNOSIS — C61 Malignant neoplasm of prostate: Secondary | ICD-10-CM | POA: Diagnosis not present

## 2019-11-18 DIAGNOSIS — Z8041 Family history of malignant neoplasm of ovary: Secondary | ICD-10-CM | POA: Insufficient documentation

## 2019-11-18 DIAGNOSIS — Z801 Family history of malignant neoplasm of trachea, bronchus and lung: Secondary | ICD-10-CM | POA: Insufficient documentation

## 2019-11-18 NOTE — Progress Notes (Signed)
REFERRING PROVIDER: No referring provider defined for this encounter.  PRIMARY PROVIDER:  [name], MD  PRIMARY REASON FOR VISIT:  1. Prostate cancer ([place])   2. Family history of prostate cancer   3. Family history of ovarian cancer   4. Family history of cancer of gallbladder   5. Family history of cancer of tongue   6. Family history of lung cancer   7. Family history of esophageal cancer   8. Family history of cervical cancer      I connected with Reginald Ford on 11/18/2019 at 10:00 am EDT by MyChart video conference and verified that I am speaking with the correct person using two identifiers.   Patient location: Home Provider location: [facility] office  HISTORY OF PRESENT ILLNESS:   Reginald Ford, a 79 y.o. male, was seen for a [place] cancer genetics consultation due to a personal and family history of cancer.  Reginald Ford presents to clinic today to discuss the possibility of a hereditary predisposition to cancer, genetic testing, and to further clarify his future cancer risks, as well as potential cancer risks for family members.    Reginald Ford was diagnosed with prostate cancer with a Gleason score of 3+3=6, and a PSA of 6.5.     Past Medical History:  Diagnosis Date  . Anemia    hx of iron infusions - 01/2014 and 01/2015  . Arthritis    shoulder  . Barrett's esophagus   . Cancer ([place])    basal cell cancer  . Cramps, muscle, general    if he gets dehyrated  . Diverticulitis   . Family history of cancer of gallbladder   . Family history of cancer of tongue   . Family history of cervical cancer   . Family history of esophageal cancer   . Family history of lung cancer   . Family history of ovarian cancer   . Family history of prostate cancer   . History of hiatal hernia   . Kidney function test abnormal    ELEVATED LEVELS  WATCHED BY PCP  . 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 Pneumonia    as a child. FALL 2018      Past Surgical  History:  Procedure Laterality Date  . ANTERIOR CERVICAL DECOMP/DISCECTOMY FUSION N/A 09/05/2017   Procedure: ANTERIOR CERVICAL DECOMPRESSION FUSION CERVICAL FOUR-FIVE,CERVICAL FIVE-SIX.;  Surgeon: Eustace Moore, MD;  Location: [facility];  Service: Neurosurgery;  Laterality: N/A;  anterior  . CATARACT EXTRACTION, BILATERAL    . COLON SURGERY    . COLONOSCOPY    . COLOSTOMY  02/2012   for Diverticulitis  . COLOSTOMY REVERSAL  05/2012  . LUMBAR LAMINECTOMY/DECOMPRESSION MICRODISCECTOMY Bilateral 08/18/2015   Procedure: Laminectomy and Foraminotomy - L4-L5 - L5-S1 - bilateral;  Surgeon: Eustace Moore, MD;  Location: [facility];  Service: Neurosurgery;  Laterality: Bilateral;  . VASECTOMY      Social History   Socioeconomic History  . Marital status: Married    Spouse name: Not on file  . Number of children: Not on file  . Years of education: Not on file  . Highest education level: Not on file  Occupational History  . Occupation: retired    Comment: Art gallery manager  Tobacco Use  . Smoking status: Never Smoker  . Smokeless tobacco: Never Used  Substance and Sexual Activity  . Alcohol use: No  . Drug use: No  . Sexual activity: Not on file  Other Topics  Concern  . Not on file  Social History Narrative  . Not on file   Social Determinants of Health   Financial Resource Strain:   . Difficulty of Paying Living Expenses:   Food Insecurity:   . Worried About Charity fundraiser in the Last Year:   . Arboriculturist in the Last Year:   Transportation Needs:   . Film/video editor (Medical):   Marland Kitchen Lack of Transportation (Non-Medical):   Physical Activity:   . Days of Exercise per Week:   . Minutes of Exercise per Session:   Stress:   . Feeling of Stress :   Social Connections:   . Frequency of Communication with Friends and Family:   . Frequency of Social Gatherings with Friends and Family:   . Attends Religious Services:   . Active Member of Clubs or Organizations:   .  Attends Archivist Meetings:   Marland Kitchen Marital Status:      FAMILY HISTORY:  We obtained a detailed, 4-generation family history.  Significant diagnoses are listed below: Family History  Problem Relation Age of Onset  . Hypertension Mother   . Hypotension Father   . Prostate cancer Father 44       metastatic  . Cancer Sister 65       gallbladder  . Lung cancer Brother 72       metastatic to brain  . Stroke Brother   . Cervical cancer Daughter 60  . Tongue cancer Maternal Uncle        dx. in his 66s  . Cancer Other 73       gallbladder; maternal great-aunt  . Ovarian cancer Niece 56  . Esophageal cancer Maternal Great-grandfather   . Barrett's esophagus Maternal Great-grandfather    Reginald Ford has one son (age 73) and one daughter (age 64) who has a history of cervical cancer diagnosed at the age of 55. He has one sister who died when she was 10 from gallbladder cancer that had metastasized. He has one brother who died at the age of 50 from lung cancer that had metastasized to his brain, causing a stroke. Reginald Ford also has a niece who was diagnosed with ovarian cancer last year at the age of 60.   Reginald Ford mother died when she was 57 and did not have cancer. He had one maternal uncle and one maternal aunt. His uncle died in his 12s from tongue cancer and had alcoholism. His aunt died in her 42s or 92s, and he is not sure if she ever had cancer. One of his maternal cousins died from small cell lung cancer at the age of 29. His maternal grandmother died in her 16s after breaking her hip, and his maternal grandfather died in his late 36s from a stroke. His great-grandfather (grandmother's father) had a history of Barrett's esophagus and esophageal cancer. His great-aunt (grandfather's sister) died from gallbladder cancer at the age of 98. There are no other known diagnoses of cancer on the maternal side of the family.  Reginald Ford father died at the age of 52 from  prostate cancer, which he believes was metastatic. His father was adopted, so he does not have any information about his paternal family history, other than his maternal grandmother's name being [employer].   Reginald Ford is unaware of previous family history of genetic testing for hereditary cancer risks. His ancestors are of [place] and [place] descent. There is no reported Ashkenazi Jewish ancestry.  There is no known consanguinity.  GENETIC COUNSELING ASSESSMENT: Reginald Ford is a 79 y.o. male with a personal history of prostate cancer and a family history of metastatic prostate cancer and ovarian cancer, which is somewhat suggestive of a hereditary cancer syndrome and predisposition to cancer. We, therefore, discussed and recommended the following at today's visit.   DISCUSSION: We discussed that approximately 5-10% of cancer is hereditary.  Most cases of hereditary prostate cancer are associated with the BRCA1/2 genes, although there are other genes that can be associated with hereditary prostate cancer syndromes.  These include ATM, CHEK2, HOXB13, the Lynch syndrome genes, etc.    Reginald Ford noted that he was initially interested in genetic testing to learn if he may have a more aggressive form of cancer and to help guide his prostate cancer treatment. We discussed that, at this time, there is limited information regarding how to follow patients who have low risk prostate cancer and an inherited gene mutation. Even if he is determined to have a genetic mutation, it may not change his current treatment plan. We discussed that the main benefits of germline genetic testing would instead include learning about risks for other cancer types, identifying potential screening and risk-reduction options that may be appropriate for those cancer risks, and to understand if other family members, such as his children, could be at risk for cancer and allow them to undergo genetic testing.  We reviewed the  characteristics, features and inheritance patterns of hereditary cancer syndromes. We also discussed genetic testing, including the appropriate family members to test, the process of testing, insurance coverage and turn-around-time for results. We discussed the implications of a negative, positive and/or variant of uncertain significant result. We recommended Reginald Ford pursue genetic testing for the [employer].   The Multi-Cancer Panel offered by [facility] includes sequencing and/or deletion duplication testing of the following 85 genes: AIP, ALK, APC, ATM, AXIN2,BAP1,  BARD1, BLM, BMPR1A, BRCA1, BRCA2, BRIP1, CASR, CDC73, CDH1, CDK4, CDKN1B, CDKN1C, CDKN2A (p14ARF), CDKN2A (p16INK4a), CEBPA, CHEK2, CTNNA1, DICER1, DIS3L2, EGFR (c.2369C>T, p.Thr790Met variant only), EPCAM (Deletion/duplication testing only), FH, FLCN, GATA2, GPC3, GREM1 (Promoter region deletion/duplication testing only), HOXB13 (c.251G>A, p.Gly84Glu), HRAS, KIT, MAX, MEN1, MET, MITF (c.952G>A, p.Glu318Lys variant only), MLH1, MSH2, MSH3, MSH6, MUTYH, NBN, NF1, NF2, NTHL1, PALB2, PDGFRA, PHOX2B, PMS2, POLD1, POLE, POT1, PRKAR1A, PTCH1, PTEN, RAD50, RAD51C, RAD51D, RB1, RECQL4, RET, RNF43, RUNX1, SDHAF2, SDHA (sequence changes only), SDHB, SDHC, SDHD, SMAD4, SMARCA4, SMARCB1, SMARCE1, STK11, SUFU, TERC, TERT, TMEM127, TP53, TSC1, TSC2, VHL, WRN and WT1.   Based on Reginald Ford's personal and family history of cancer, he meets medical criteria for genetic testing. Despite that he meets criteria, he may still have an out of pocket cost. We discussed that if his out of pocket cost for testing is over $100, the laboratory will call and confirm whether he wants to proceed with testing.  If the out of pocket cost of testing is less than $100 he will be billed by the genetic testing laboratory.   Lastly, we discussed that some people do not want to undergo genetic testing due to fear of genetic discrimination.  A federal law called  the Genetic Information Non-Discrimination Act (GINA) of 2008 helps protect individuals against genetic discrimination based on their genetic test results.  It impacts both health insurance and employment.  With health insurance, it protects against increased premiums, being kicked off insurance or being forced to take a test in order to be insured.  For employment it  protects against hiring, firing and promoting decisions based on genetic test results.  Health status due to a cancer diagnosis is not protected under GINA.  Importantly, life, disability, and long-term care insurance is not protected under GINA.    PLAN: After considering the risks, benefits, and limitations, Reginald Ford provided informed consent to pursue genetic testing and the blood sample was sent to [facility] for analysis of the Multi-Cancer Panel. Results should be available within approximately two-three weeks' time, at which point they will be disclosed by telephone to Reginald Ford, as will any additional recommendations warranted by these results. Reginald Ford will receive a summary of his genetic counseling visit and a copy of his results once available. This information will also be available in Epic.   Reginald Ford questions were answered to his satisfaction today. Our contact information was provided should additional questions or concerns arise. Thank you for the referral and allowing [place] to share in the care of your patient.   Reginald Ford, [place], [facility] Licensed, Certified Dispensing [email or web address] Phone: [phone]  The patient was seen for a total of 60 minutes in face-to-face genetic counseling.  This patient was discussed with Drs. Magrinat, Lindi Adie and/or Burr Medico who agrees with the above.    _______________________________________________________________________ For Office Staff:  Number of people involved in session: 1 Was an Intern/ student involved with case: no

## 2019-11-23 DIAGNOSIS — Z0120 Encounter for dental examination and cleaning without abnormal findings: Secondary | ICD-10-CM | POA: Diagnosis not present

## 2019-11-25 ENCOUNTER — Encounter: Payer: Self-pay | Admitting: Genetic Counselor

## 2019-11-25 ENCOUNTER — Telehealth: Payer: Self-pay | Admitting: Genetic Counselor

## 2019-11-25 ENCOUNTER — Ambulatory Visit: Payer: Self-pay | Admitting: Genetic Counselor

## 2019-11-25 DIAGNOSIS — Z1379 Encounter for other screening for genetic and chromosomal anomalies: Secondary | ICD-10-CM

## 2019-11-25 HISTORY — DX: Encounter for other screening for genetic and chromosomal anomalies: Z13.79

## 2019-11-25 NOTE — Progress Notes (Signed)
HPI:  Reginald Ford was previously seen in the [facility] clinic due to a personal and family history of cancer and concerns regarding a hereditary predisposition to cancer. Please refer to our prior cancer genetics clinic note for more information regarding our discussion, assessment and recommendations, at the time. Reginald Ford recent genetic test results were disclosed to him, as were recommendations warranted by these results. These results and recommendations are discussed in more detail below.  CANCER HISTORY:  Oncology History  Prostate cancer ([place])  11/18/2019 Initial Diagnosis   Prostate cancer ([place])   11/23/2019 Genetic Testing   Negative genetic testing:  No pathogenic variants detected on the [facility] Multi-Cancer Panel. The report date is 11/23/2019.  The Multi-Cancer Panel offered by [facility] includes sequencing and/or deletion duplication testing of the following 85 genes: AIP, ALK, APC, ATM, AXIN2,BAP1,  BARD1, BLM, BMPR1A, BRCA1, BRCA2, BRIP1, CASR, CDC73, CDH1, CDK4, CDKN1B, CDKN1C, CDKN2A (p14ARF), CDKN2A (p16INK4a), CEBPA, CHEK2, CTNNA1, DICER1, DIS3L2, EGFR (c.2369C>T, p.Thr790Met variant only), EPCAM (Deletion/duplication testing only), FH, FLCN, GATA2, GPC3, GREM1 (Promoter region deletion/duplication testing only), HOXB13 (c.251G>A, p.Gly84Glu), HRAS, KIT, MAX, MEN1, MET, MITF (c.952G>A, p.Glu318Lys variant only), MLH1, MSH2, MSH3, MSH6, MUTYH, NBN, NF1, NF2, NTHL1, PALB2, PDGFRA, PHOX2B, PMS2, POLD1, POLE, POT1, PRKAR1A, PTCH1, PTEN, RAD50, RAD51C, RAD51D, RB1, RECQL4, RET, RNF43, RUNX1, SDHAF2, SDHA (sequence changes only), SDHB, SDHC, SDHD, SMAD4, SMARCA4, SMARCB1, SMARCE1, STK11, SUFU, TERC, TERT, TMEM127, TP53, TSC1, TSC2, VHL, WRN and WT1.      FAMILY HISTORY:  We obtained a detailed, 4-generation family history.  Significant diagnoses are listed below: Family History  Problem Relation Age of Onset  . Hypertension Mother   . Hypotension Father   .  Prostate cancer Father 61       metastatic  . Cancer Sister 14       gallbladder  . Lung cancer Brother 72       metastatic to brain  . Stroke Brother   . Cervical cancer Daughter 67  . Tongue cancer Maternal Uncle        dx. in his 89s  . Cancer Other 73       gallbladder; maternal great-aunt  . Ovarian cancer Niece 57  . Esophageal cancer Maternal Great-grandfather   . Barrett's esophagus Maternal Great-grandfather    Reginald Ford has one son (age 73) and one daughter (age 69) who has a history of cervical cancer diagnosed at the age of 40. He has one sister who died when she was 10 from gallbladder cancer that had metastasized. He has one brother who died at the age of 48 from lung cancer that had metastasized to his brain, causing a stroke. Reginald Ford also has a niece who was diagnosed with ovarian cancer last year at the age of 65.   Reginald Ford mother died when she was 72 and did not have cancer. He had one maternal uncle and one maternal aunt. His uncle died in his 44s from tongue cancer and had alcoholism. His aunt died in her 40s or 35s, and he is not sure if she ever had cancer. One of his maternal cousins died from small cell lung cancer at the age of 53. His maternal grandmother died in her 52s after breaking her hip, and his maternal grandfather died in his late 60s from a stroke. His great-grandfather (grandmother's father) had a history of Barrett's esophagus and esophageal cancer. His great-aunt (grandfather's sister) died from gallbladder cancer at the age of 85. There are no  other known diagnoses of cancer on the maternal side of the family.  Reginald Ford father died at the age of 27 from prostate cancer, which he believes was metastatic. His father was adopted, so he does not have any information about his paternal family history, other than his maternal grandmother's name being [employer].   Reginald Ford is unaware of previous family history of genetic testing for  hereditary cancer risks. His ancestors are of [place] and [place] descent. There is no reported Ashkenazi Jewish ancestry. There is no known consanguinity.  GENETIC TEST RESULTS: Genetic testing reported out on 11/23/2019 through the [facility] Multi-Cancer panel. No pathogenic variants were detected.   The Multi-Cancer Panel offered by [facility] includes sequencing and/or deletion duplication testing of the following 85 genes: AIP, ALK, APC, ATM, AXIN2,BAP1,  BARD1, BLM, BMPR1A, BRCA1, BRCA2, BRIP1, CASR, CDC73, CDH1, CDK4, CDKN1B, CDKN1C, CDKN2A (p14ARF), CDKN2A (p16INK4a), CEBPA, CHEK2, CTNNA1, DICER1, DIS3L2, EGFR (c.2369C>T, p.Thr790Met variant only), EPCAM (Deletion/duplication testing only), FH, FLCN, GATA2, GPC3, GREM1 (Promoter region deletion/duplication testing only), HOXB13 (c.251G>A, p.Gly84Glu), HRAS, KIT, MAX, MEN1, MET, MITF (c.952G>A, p.Glu318Lys variant only), MLH1, MSH2, MSH3, MSH6, MUTYH, NBN, NF1, NF2, NTHL1, PALB2, PDGFRA, PHOX2B, PMS2, POLD1, POLE, POT1, PRKAR1A, PTCH1, PTEN, RAD50, RAD51C, RAD51D, RB1, RECQL4, RET, RNF43, RUNX1, SDHAF2, SDHA (sequence changes only), SDHB, SDHC, SDHD, SMAD4, SMARCA4, SMARCB1, SMARCE1, STK11, SUFU, TERC, TERT, TMEM127, TP53, TSC1, TSC2, VHL, WRN and WT1. The test report will be scanned into EPIC and located under the Molecular Pathology section of the Results Review tab.  A portion of the result report is included below for reference.     We discussed with Reginald Ford that because current genetic testing is not perfect, it is possible there may be a gene mutation in one of these genes that current testing cannot detect, but that chance is small.  We also discussed, that there could be another gene that has not yet been discovered, or that we have not yet tested, that is responsible for the cancer diagnoses in the family. It is also possible there is a hereditary cause for the cancer in the family that Reginald Ford did not inherit and therefore was not  identified in his testing. Therefore, it is important to remain in touch with cancer genetics in the future so that we can continue to offer Reginald Ford the most up to date genetic testing.   ADDITIONAL GENETIC TESTING:  Reginald Ford genetic test for hereditary cancer risks was fairly comprehensive. We briefly discussed other types of genetic testing, like Oncotype DX testing. We discussed that this is a type of genetic test that is done on the tumor to see how aggressive an individual's prostate cancer may be and to predict the likelihood of adverse outcomes. As Reginald Ford had previously expressed interest in learning about this information, we recommended that he discuss this with his urologist to see if it would be an appropriate test for him.   CANCER SCREENING RECOMMENDATIONS:  Reginald Ford test result is considered negative (normal).  This means that we have not identified a hereditary cause for his personal and family history of cancer at this time. Most cancers happen by chance and this negative test suggests that his personal and family of cancer may fall into this category.    While reassuring, this does not definitively rule out a hereditary predisposition to cancer. It is still possible that there could be genetic mutations that are undetectable by current technology. There could be genetic mutations in genes  that have not been tested or identified to increase cancer risk. Therefore, it is recommended he continue to follow the cancer management and screening guidelines provided by his healthcare providers.   An individual's cancer risk and medical management are not determined by genetic test results alone. Overall cancer risk assessment incorporates additional factors, including personal medical history, family history, and any available genetic information that may result in a personalized plan for cancer prevention and surveillance.  RECOMMENDATIONS FOR FAMILY MEMBERS:  Individuals in  this family might be at some increased risk of developing cancer, over the general population risk, simply due to the family history of cancer.  We recommend women in this family have a yearly mammogram beginning at age 82, or 77 years younger than the earliest onset of cancer, an annual clinical breast exam, and perform monthly breast self-exams. Women in this family should also have a gynecological exam as recommended by their primary provider. All family members should have a colonoscopy by age 17.  It is also possible there is a hereditary cause for the cancer in Reginald Ford's family that he did not inherit and therefore was not identified in him. Based on Reginald Ford's family history, we recommended his niece, who was diagnosed with ovarian cancer at age 52, have genetic counseling and testing. Reginald Ford will let us know if we can be of any assistance in coordinating genetic counseling and/or testing for this family member.   FOLLOW-UP: Lastly, we discussed with Reginald Ford that cancer genetics is a rapidly advancing field and it is possible that new genetic tests will be appropriate for him and/or his family members in the future. We encouraged him to remain in contact with cancer genetics on an annual basis so we can update his personal and family histories and let him know of advances in cancer genetics that may benefit this family.   Our contact number was provided. Reginald Ford questions were answered to his satisfaction, and he knows he is welcome to call us at anytime with additional questions or concerns.   Clint Guy, MS, [facility] Genetic Counselor [email or web address] Phone: [phone]

## 2019-11-25 NOTE — Telephone Encounter (Signed)
LVM that his genetic test results are available and requested that he call back to discuss them.  

## 2019-11-25 NOTE — Telephone Encounter (Signed)
Revealed negative genetic testing. Discussed that we do not know why he has prostate cancer or why there is cancer in the family. There could be a genetic mutation in the family that Reginald Ford did not inherit. There could also be a mutation in a different gene that we are not testing, or our current technology may not be able detect certain mutations. It will be a good idea for him to stay in contact with genetics to keep up with whether additional testing may be appropriate in the future.   We also briefly discussed Oncotype DX testing. Discussed that this is a type of genetic test that is done on the tumor to see how aggressive his prostate cancer may be and to predict the likelihood of adverse outcomes. As Reginald Ford had previously expressed interest in learning about this information, I recommended that he discuss this with his urologist to see if it would be an appropriate test for him.

## 2019-11-26 DIAGNOSIS — M79605 Pain in left leg: Secondary | ICD-10-CM | POA: Diagnosis not present

## 2019-11-27 DIAGNOSIS — M8589 Other specified disorders of bone density and structure, multiple sites: Secondary | ICD-10-CM | POA: Diagnosis not present

## 2019-11-30 DIAGNOSIS — R768 Other specified abnormal immunological findings in serum: Secondary | ICD-10-CM | POA: Diagnosis not present

## 2019-11-30 DIAGNOSIS — D472 Monoclonal gammopathy: Secondary | ICD-10-CM | POA: Diagnosis not present

## 2019-11-30 DIAGNOSIS — C61 Malignant neoplasm of prostate: Secondary | ICD-10-CM | POA: Diagnosis not present

## 2019-11-30 DIAGNOSIS — D509 Iron deficiency anemia, unspecified: Secondary | ICD-10-CM | POA: Diagnosis not present

## 2019-12-07 DIAGNOSIS — Z0120 Encounter for dental examination and cleaning without abnormal findings: Secondary | ICD-10-CM | POA: Diagnosis not present

## 2019-12-10 DIAGNOSIS — Z79899 Other long term (current) drug therapy: Secondary | ICD-10-CM | POA: Diagnosis not present

## 2019-12-10 DIAGNOSIS — Z7952 Long term (current) use of systemic steroids: Secondary | ICD-10-CM | POA: Diagnosis not present

## 2019-12-10 DIAGNOSIS — G250 Essential tremor: Secondary | ICD-10-CM | POA: Diagnosis not present

## 2019-12-10 DIAGNOSIS — Z880 Allergy status to penicillin: Secondary | ICD-10-CM | POA: Diagnosis not present

## 2019-12-10 DIAGNOSIS — G7000 Myasthenia gravis without (acute) exacerbation: Secondary | ICD-10-CM | POA: Diagnosis not present

## 2019-12-10 DIAGNOSIS — Z888 Allergy status to other drugs, medicaments and biological substances status: Secondary | ICD-10-CM | POA: Diagnosis not present

## 2019-12-10 DIAGNOSIS — Z91041 Radiographic dye allergy status: Secondary | ICD-10-CM | POA: Diagnosis not present

## 2019-12-10 DIAGNOSIS — C61 Malignant neoplasm of prostate: Secondary | ICD-10-CM | POA: Diagnosis not present

## 2019-12-10 DIAGNOSIS — M5416 Radiculopathy, lumbar region: Secondary | ICD-10-CM | POA: Diagnosis not present

## 2019-12-18 ENCOUNTER — Telehealth: Payer: Self-pay | Admitting: Oncology

## 2019-12-18 NOTE — Telephone Encounter (Signed)
Received a new hem referral from Dr. Ardeth Perfect for anemia. Mr. Estock has been cld and scheduled to see Dr. Alen Blew on 5/27 at 11am. Pt aware to arrive 15 minutes early.

## 2019-12-21 ENCOUNTER — Telehealth: Payer: Self-pay | Admitting: Oncology

## 2019-12-21 NOTE — Telephone Encounter (Signed)
Reginald Ford has been rescheduled to see Dr. Alen Blew to 5/27 at 2pm. I cld and left the new appt time on the pt's voicemail.

## 2019-12-24 ENCOUNTER — Inpatient Hospital Stay: Payer: Medicare Other | Attending: Genetic Counselor | Admitting: Oncology

## 2019-12-24 ENCOUNTER — Encounter: Payer: Medicare Other | Admitting: Oncology

## 2019-12-24 ENCOUNTER — Other Ambulatory Visit: Payer: Self-pay

## 2019-12-24 ENCOUNTER — Inpatient Hospital Stay: Payer: Medicare Other

## 2019-12-24 VITALS — BP 147/85 | HR 71 | Temp 97.9°F | Resp 18 | Ht 67.0 in | Wt 175.8 lb

## 2019-12-24 DIAGNOSIS — Z85828 Personal history of other malignant neoplasm of skin: Secondary | ICD-10-CM | POA: Insufficient documentation

## 2019-12-24 DIAGNOSIS — Z79899 Other long term (current) drug therapy: Secondary | ICD-10-CM | POA: Diagnosis not present

## 2019-12-24 DIAGNOSIS — Z8049 Family history of malignant neoplasm of other genital organs: Secondary | ICD-10-CM

## 2019-12-24 DIAGNOSIS — M5416 Radiculopathy, lumbar region: Secondary | ICD-10-CM | POA: Diagnosis not present

## 2019-12-24 DIAGNOSIS — Z800 Family history of malignant neoplasm of digestive organs: Secondary | ICD-10-CM | POA: Diagnosis not present

## 2019-12-24 DIAGNOSIS — G4700 Insomnia, unspecified: Secondary | ICD-10-CM

## 2019-12-24 DIAGNOSIS — Z801 Family history of malignant neoplasm of trachea, bronchus and lung: Secondary | ICD-10-CM | POA: Insufficient documentation

## 2019-12-24 DIAGNOSIS — G7000 Myasthenia gravis without (acute) exacerbation: Secondary | ICD-10-CM | POA: Diagnosis not present

## 2019-12-24 DIAGNOSIS — Z7952 Long term (current) use of systemic steroids: Secondary | ICD-10-CM | POA: Diagnosis not present

## 2019-12-24 DIAGNOSIS — Z8042 Family history of malignant neoplasm of prostate: Secondary | ICD-10-CM | POA: Diagnosis not present

## 2019-12-24 DIAGNOSIS — C61 Malignant neoplasm of prostate: Secondary | ICD-10-CM | POA: Diagnosis not present

## 2019-12-24 DIAGNOSIS — Z8249 Family history of ischemic heart disease and other diseases of the circulatory system: Secondary | ICD-10-CM | POA: Diagnosis not present

## 2019-12-24 DIAGNOSIS — D472 Monoclonal gammopathy: Secondary | ICD-10-CM

## 2019-12-24 DIAGNOSIS — Z8041 Family history of malignant neoplasm of ovary: Secondary | ICD-10-CM | POA: Diagnosis not present

## 2019-12-24 DIAGNOSIS — Z8546 Personal history of malignant neoplasm of prostate: Secondary | ICD-10-CM | POA: Diagnosis not present

## 2019-12-24 LAB — CMP (CANCER CENTER ONLY)
ALT: 30 U/L (ref 0–44)
AST: 25 U/L (ref 15–41)
Albumin: 4.1 g/dL (ref 3.5–5.0)
Alkaline Phosphatase: 49 U/L (ref 38–126)
Anion gap: 7 (ref 5–15)
BUN: 30 mg/dL — ABNORMAL HIGH (ref 8–23)
CO2: 28 mmol/L (ref 22–32)
Calcium: 9 mg/dL (ref 8.9–10.3)
Chloride: 107 mmol/L (ref 98–111)
Creatinine: 1.25 mg/dL — ABNORMAL HIGH (ref 0.61–1.24)
GFR, Est AFR Am: >60 mL/min (ref >60)
GFR, Estimated: 55 mL/min — ABNORMAL LOW (ref >60)
Glucose, Bld: 128 mg/dL — ABNORMAL HIGH (ref 70–99)
Potassium: 4.4 mmol/L (ref 3.5–5.1)
Sodium: 142 mmol/L (ref 135–145)
Total Bilirubin: 0.6 mg/dL (ref 0.3–1.2)
Total Protein: 6.3 g/dL — ABNORMAL LOW (ref 6.5–8.1)

## 2019-12-24 LAB — CBC WITH DIFFERENTIAL (CANCER CENTER ONLY)
Abs Immature Granulocytes: 0.04 10*3/uL (ref 0.00–0.07)
Basophils Absolute: 0.1 10*3/uL (ref 0.0–0.1)
Basophils Relative: 1 %
Eosinophils Absolute: 0.1 10*3/uL (ref 0.0–0.5)
Eosinophils Relative: 1 %
HCT: 44.3 % (ref 39.0–52.0)
Hemoglobin: 14.4 g/dL (ref 13.0–17.0)
Immature Granulocytes: 1 %
Lymphocytes Relative: 9 %
Lymphs Abs: 0.8 10*3/uL (ref 0.7–4.0)
MCH: 31 pg (ref 26.0–34.0)
MCHC: 32.5 g/dL (ref 30.0–36.0)
MCV: 95.3 fL (ref 80.0–100.0)
Monocytes Absolute: 0.7 10*3/uL (ref 0.1–1.0)
Monocytes Relative: 8 %
Neutro Abs: 6.7 10*3/uL (ref 1.7–7.7)
Neutrophils Relative %: 80 %
Platelet Count: 197 10*3/uL (ref 150–400)
RBC: 4.65 MIL/uL (ref 4.22–5.81)
RDW: 13.2 % (ref 11.5–15.5)
WBC Count: 8.3 10*3/uL (ref 4.0–10.5)
nRBC: 0 % (ref 0.0–0.2)

## 2019-12-24 NOTE — Progress Notes (Signed)
Reason for the request:    MGUS  HPI: I was asked by Dr. Ardeth Perfect to evaluate Mr. Reginald Ford for diagnosis of plasma cell disorder.  He is a 79 year old man with a history of myasthenia gravis and Barrett's esophagus presented with abnormal serum protein electrophoresis in 2014.  At that time he was evaluated by Dr. Denman George at [facility] while he was living in [place].  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  His most recent labs obtained in June 2020 showed a hemoglobin of 14.9 with MCV of 97.  His IgG level was 563 with an M spike of 0.2 g/dL.  He has recently relocated to [place] wants to establish care.  He was recently diagnosed with prostate cancer after he was found to have a PSA of 6.3.  He underwent a biopsy on March 9 at the care of Dr. Karsten Ro which showed a Gleason score 3+3 equal 6 and 1 core of 20%.  Given his family history of prostate cancer and other malignancies he underwent genetic counseling without any evidence of a familial disorder.  Clinically, he reports no specific complaints at this time.  He denies any bone pain, pathological fractures or recent hospitalizations.  He continues to be active and attends activities of daily living.  He does not report any headaches, blurry vision, syncope or seizures. Does not report any fevers, chills or sweats.  Does not report any cough, wheezing or hemoptysis.  Does not report any chest pain, palpitation, orthopnea or leg edema.  Does not report any nausea, vomiting or abdominal pain.  Does not report any constipation or diarrhea.  Does not report any skeletal complaints.    Does not report frequency, urgency or hematuria.  Does not report any skin rashes or lesions. Does not report any heat or cold intolerance.  Does not report any lymphadenopathy or petechiae.  Does not report any anxiety or depression.  Remaining review of systems is negative.    Past Medical History:  Diagnosis Date   . Anemia    hx of iron infusions - 01/2014 and 01/2015  . Arthritis    shoulder  . Barrett's esophagus   . Cancer ([place])    basal cell cancer  . Cramps, muscle, general    if he gets dehyrated  . Diverticulitis   . Family history of cancer of gallbladder   . Family history of cancer of tongue   . Family history of cervical cancer   . Family history of esophageal cancer   . Family history of lung cancer   . Family history of ovarian cancer   . Family history of prostate cancer   . Genetic testing 11/25/2019   Negative genetic testing:  No pathogenic variants detected on the [employer] Multi-Cancer Panel. The report date is 11/23/2019.  Marland Kitchen History of hiatal hernia   . Kidney function test abnormal    ELEVATED LEVELS  WATCHED BY PCP  . Myasthenia gravis ([place])    "ocular" myasthenia gravis  . Pneumonia    as a child. FALL 2018    :  Past Surgical History:  Procedure Laterality Date  . ANTERIOR CERVICAL DECOMP/DISCECTOMY FUSION N/A 09/05/2017   Procedure: ANTERIOR CERVICAL DECOMPRESSION FUSION CERVICAL FOUR-FIVE,CERVICAL FIVE-SIX.;  Surgeon: Eustace Moore, MD;  Location: [facility];  Service: Neurosurgery;  Laterality: N/A;  anterior  . CATARACT EXTRACTION, BILATERAL    . COLON SURGERY    . COLONOSCOPY    . COLOSTOMY  02/2012   for Diverticulitis  . COLOSTOMY REVERSAL  05/2012  . LUMBAR LAMINECTOMY/DECOMPRESSION MICRODISCECTOMY Bilateral 08/18/2015   Procedure: Laminectomy and Foraminotomy - L4-L5 - L5-S1 - bilateral;  Surgeon: Eustace Moore, MD;  Location: [facility];  Service: Neurosurgery;  Laterality: Bilateral;  . VASECTOMY    :   Current Outpatient Medications:  .  acetaminophen (TYLENOL) 500 MG tablet, Take 1,000 mg by mouth every 6 (six) hours as needed (for pain.)., Disp: , Rfl:  .  Alpha-Lipoic Acid 300 MG TABS, Take 300 mg by mouth daily., Disp: , Rfl:  .  B Complex Vitamins (B COMPLEX 50 PO), Take 1 tablet by mouth 2 (two) times daily., Disp: , Rfl:  .  CALCIUM CITRATE PO,  Take 1,000 mg by mouth 2 (two) times daily., Disp: , Rfl:  .  Cholecalciferol (VITAMIN D3) 2000 units TABS, Take 2,000 Units by mouth 2 (two) times daily., Disp: , Rfl:  .  Coenzyme Q10 (COQ10) 200 MG CAPS, Take 200 mg by mouth daily., Disp: , Rfl:  .  Digestive Enzymes (DIGESTIVE ENZYME PO), Take 1 capsule by mouth 2 (two) times daily., Disp: , Rfl:  .  Magnesium Bisglycinate (MAG GLYCINATE PO), Take 400 mg by mouth 2 (two) times daily., Disp: , Rfl:  .  Multiple Vitamin (MULTIVITAMIN WITH MINERALS) TABS tablet, Take 1 tablet by mouth daily., Disp: , Rfl:  .  omeprazole (PRILOSEC) 20 MG capsule, Take 20 mg by mouth daily before breakfast. , Disp: , Rfl:  .  predniSONE (DELTASONE) 10 MG tablet, Take 5-20 mg by mouth See admin instructions. Typically takes 5 mg by mouth daily but is taking 20 mg by mouth daily for  a week before and after surgery, Disp: , Rfl: 3 .  Probiotic Product (PROBIOTIC PO), Take 1 capsule by mouth 2 (two) times daily., Disp: , Rfl:  .  pyridostigmine (MESTINON) 60 MG tablet, Take 120 mg by mouth 3 (three) times daily. Morning, Lunch, & supper, Disp: , Rfl:  .  Saw Palmetto, Serenoa repens, 320 MG CAPS, Take 320 mg by mouth daily., Disp: , Rfl:  .  vitamin C (ASCORBIC ACID) 500 MG tablet, Take 500 mg by mouth daily., Disp: , Rfl: :  Allergies  Allergen Reactions  . Aminoglycosides Other (See Comments)    Medication may cause weakness from myasthenia gravis  . Beta Adrenergic Blockers Other (See Comments)    Medication may cause weakness from myasthenia gravis  . Botulinum Toxins Other (See Comments)    Medication may cause weakness from myasthenia gravis  . Calcium Channel Blockers Other (See Comments)    Medication may cause weakness from myasthenia gravis  . Curare [Tubocurarine] Other (See Comments)    Medication may cause weakness from myasthenia gravis  . Interferons Other (See Comments)    Medication may cause weakness from myasthenia gravis  . Iodinated  Diagnostic Agents Other (See Comments)    Medication may cause weakness from myasthenia gravis  . Lithium Other (See Comments)    Medication may cause Weakness from myasthenia gravis  . Macrolides And Ketolides Other (See Comments)    Medication may cause weakness from myastenia gravis  . Other Other (See Comments)    Magnesium salts, Fluoroquinolones  . Penicillamine Other (See Comments)    Medication may cause weakness from myasthenia gravis  . Quinine Derivatives Other (See Comments)    Medication may cause weakness from myastenia gravis  . Statins Other (See Comments)    Medication may cause  weakness from myasthenia gravis  :  Family History  Problem Relation Age of Onset  . Hypertension Mother   . Hypotension Father   . Prostate cancer Father 33       metastatic  . Cancer Sister 52       gallbladder  . Lung cancer Brother 72       metastatic to brain  . Stroke Brother   . Cervical cancer Daughter 66  . Tongue cancer Maternal Uncle        dx. in his 64s  . Cancer Other 73       gallbladder; maternal great-aunt  . Ovarian cancer Niece 56  . Esophageal cancer Maternal Great-grandfather   . Barrett's esophagus Maternal Great-grandfather   :  Social History   Socioeconomic History  . Marital status: Married    Spouse name: Not on file  . Number of children: Not on file  . Years of education: Not on file  . Highest education level: Not on file  Occupational History  . Occupation: retired    Comment: Art gallery manager  Tobacco Use  . Smoking status: Never Smoker  . Smokeless tobacco: Never Used  Substance and Sexual Activity  . Alcohol use: No  . Drug use: No  . Sexual activity: Not on file  Other Topics Concern  . Not on file  Social History Narrative  . Not on file   Social Determinants of Health   Financial Resource Strain:   . Difficulty of Paying Living Expenses:   Food Insecurity:   . Worried About Charity fundraiser in the Last Year:   . Arts development officer in the Last Year:   Transportation Needs:   . Film/video editor (Medical):   Marland Kitchen Lack of Transportation (Non-Medical):   Physical Activity:   . Days of Exercise per Week:   . Minutes of Exercise per Session:   Stress:   . Feeling of Stress :   Social Connections:   . Frequency of Communication with Friends and Family:   . Frequency of Social Gatherings with Friends and Family:   . Attends Religious Services:   . Active Member of Clubs or Organizations:   . Attends Archivist Meetings:   Marland Kitchen Marital Status:   Intimate Partner Violence:   . Fear of Current or Ex-Partner:   . Emotionally Abused:   Marland Kitchen Physically Abused:   . Sexually Abused:   :  Pertinent items are noted in HPI.  Exam: Blood pressure (!) 147/85, pulse 71, temperature 97.9 F (36.6 C), temperature source Temporal, resp. rate 18, height 5' 7" (1.702 m), weight 175 lb 12.8 oz (79.7 kg), SpO2 98 %.  ECOG 0 General appearance: alert and cooperative appeared without distress. Head: atraumatic without any abnormalities. Eyes: conjunctivae/corneas clear. PERRL.  Sclera anicteric. Throat: lips, mucosa, and tongue normal; without oral thrush or ulcers. Resp: clear to auscultation bilaterally without rhonchi, wheezes or dullness to percussion. Cardio: regular rate and rhythm, S1, S2 normal, no murmur, click, rub or gallop GI: soft, non-tender; bowel sounds normal; no masses,  no organomegaly Skin: Skin color, texture, turgor normal. No rashes or lesions Lymph nodes: Cervical, supraclavicular, and axillary nodes normal. Neurologic: Grossly normal without any motor, sensory or deep tendon reflexes. Musculoskeletal: No joint deformity or effusion.  CBC    Component Value Date/Time   WBC 10.2 09/02/2017 1118   RBC 4.96 09/02/2017 1118   HGB 15.4 09/02/2017 1118   HCT 46.5 09/02/2017 1118  PLT 212 09/02/2017 1118   MCV 93.8 09/02/2017 1118   MCH 31.0 09/02/2017 1118   MCHC 33.1 09/02/2017 1118    RDW 13.6 09/02/2017 1118   LYMPHSABS 0.7 09/02/2017 1118   MONOABS 0.5 09/02/2017 1118   EOSABS 0.0 09/02/2017 1118   BASOSABS 0.0 09/02/2017 1118     Assessment and Plan:   79 year old man with:  1.  IgG lambda MGUS diagnosed in 2014.  Laboratory data from June 2020 showed a normal IgG level with an M spike of 0.2 g/dL.  The natural course of this disease was reviewed and differential diagnosis was reiterated.  Monoclonal gammopathy of undetermined significance remains the most likely diagnosis.  I see no evidence to suggest multiple myeloma for amyloidosis.  The risk of progression into multiple myeloma was discussed at this time which remains at 1 % per year without any clear evidence of endorgan damage noted.  Signs and symptoms of multiple myeloma were reviewed which include worsening anemia, hypercalcemia, bone pain among others.  The plan is to continue with active surveillance with annual laboratory testing.  He will have a repeat serum protein electrophoresis done today and done annually after that.  2.  Prostate cancer: He was found to have stage T1c Gleason score 3+3 equal 6 and 20% of 1 out of 12 cores in March 1.  The natural course of this disease was discussed today in details.  Treatment options were also reviewed and at this time I advised him to continue with active surveillance as he is doing under the care of Dr. Karsten Ro.  Treatment options such as surgical therapy as well as radiation would be deferred if he has signs or symptoms of more progressive disease.  3.  Follow-up: Will be in 1 year for repeat evaluation.   60  minutes were dedicated to this visit. The time was spent on reviewing laboratory data, discussing treatment options, discussing differential diagnosis and answering questions regarding future plan.     A copy of this consult has been forwarded to the requesting physician.

## 2019-12-25 ENCOUNTER — Telehealth: Payer: Self-pay | Admitting: Oncology

## 2019-12-25 LAB — KAPPA/LAMBDA LIGHT CHAINS
Kappa free light chain: 18.4 mg/L (ref 3.3–19.4)
Kappa, lambda light chain ratio: 1.7 — ABNORMAL HIGH (ref 0.26–1.65)
Lambda free light chains: 10.8 mg/L (ref 5.7–26.3)

## 2019-12-25 NOTE — Telephone Encounter (Signed)
Scheduled appt per 5/27 los.  Was not able to reach pt nor leave a vm.  A calendar will be mailed out.

## 2019-12-29 DIAGNOSIS — M5416 Radiculopathy, lumbar region: Secondary | ICD-10-CM | POA: Diagnosis not present

## 2019-12-30 LAB — MULTIPLE MYELOMA PANEL, SERUM
Albumin SerPl Elph-Mcnc: 3.5 g/dL (ref 2.9–4.4)
Albumin/Glob SerPl: 1.6 (ref 0.7–1.7)
Alpha 1: 0.2 g/dL (ref 0.0–0.4)
Alpha2 Glob SerPl Elph-Mcnc: 0.7 g/dL (ref 0.4–1.0)
B-Globulin SerPl Elph-Mcnc: 0.7 g/dL (ref 0.7–1.3)
Gamma Glob SerPl Elph-Mcnc: 0.6 g/dL (ref 0.4–1.8)
Globulin, Total: 2.2 g/dL (ref 2.2–3.9)
IgA: 165 mg/dL (ref 61–437)
IgG (Immunoglobin G), Serum: 530 mg/dL — ABNORMAL LOW (ref 603–1613)
IgM (Immunoglobulin M), Srm: 179 mg/dL — ABNORMAL HIGH (ref 15–143)
M Protein SerPl Elph-Mcnc: 0.2 g/dL — ABNORMAL HIGH
Total Protein ELP: 5.7 g/dL — ABNORMAL LOW (ref 6.0–8.5)

## 2019-12-31 ENCOUNTER — Encounter: Payer: Self-pay | Admitting: Oncology

## 2020-01-14 DIAGNOSIS — C61 Malignant neoplasm of prostate: Secondary | ICD-10-CM | POA: Diagnosis not present

## 2020-01-25 DIAGNOSIS — C61 Malignant neoplasm of prostate: Secondary | ICD-10-CM | POA: Diagnosis not present

## 2020-01-28 DIAGNOSIS — Z8546 Personal history of malignant neoplasm of prostate: Secondary | ICD-10-CM | POA: Diagnosis not present

## 2020-01-28 DIAGNOSIS — K2270 Barrett's esophagus without dysplasia: Secondary | ICD-10-CM | POA: Diagnosis not present

## 2020-01-28 DIAGNOSIS — K429 Umbilical hernia without obstruction or gangrene: Secondary | ICD-10-CM | POA: Diagnosis not present

## 2020-01-28 DIAGNOSIS — C61 Malignant neoplasm of prostate: Secondary | ICD-10-CM | POA: Diagnosis not present

## 2020-02-16 DIAGNOSIS — C61 Malignant neoplasm of prostate: Secondary | ICD-10-CM | POA: Diagnosis not present

## 2020-02-16 DIAGNOSIS — Z8546 Personal history of malignant neoplasm of prostate: Secondary | ICD-10-CM | POA: Diagnosis not present

## 2020-05-23 DIAGNOSIS — M859 Disorder of bone density and structure, unspecified: Secondary | ICD-10-CM | POA: Diagnosis not present

## 2020-05-23 DIAGNOSIS — Z125 Encounter for screening for malignant neoplasm of prostate: Secondary | ICD-10-CM | POA: Diagnosis not present

## 2020-05-23 DIAGNOSIS — N1831 Chronic kidney disease, stage 3a: Secondary | ICD-10-CM | POA: Diagnosis not present

## 2020-05-30 DIAGNOSIS — M792 Neuralgia and neuritis, unspecified: Secondary | ICD-10-CM | POA: Diagnosis not present

## 2020-05-30 DIAGNOSIS — N400 Enlarged prostate without lower urinary tract symptoms: Secondary | ICD-10-CM | POA: Diagnosis not present

## 2020-05-30 DIAGNOSIS — Z0000 Encounter for general adult medical examination without abnormal findings: Secondary | ICD-10-CM | POA: Diagnosis not present

## 2020-05-30 DIAGNOSIS — R82998 Other abnormal findings in urine: Secondary | ICD-10-CM | POA: Diagnosis not present

## 2020-05-30 DIAGNOSIS — M1990 Unspecified osteoarthritis, unspecified site: Secondary | ICD-10-CM | POA: Diagnosis not present

## 2020-06-03 DIAGNOSIS — K2270 Barrett's esophagus without dysplasia: Secondary | ICD-10-CM | POA: Diagnosis not present

## 2020-06-03 DIAGNOSIS — Z8719 Personal history of other diseases of the digestive system: Secondary | ICD-10-CM | POA: Diagnosis not present

## 2020-06-03 DIAGNOSIS — K449 Diaphragmatic hernia without obstruction or gangrene: Secondary | ICD-10-CM | POA: Diagnosis not present

## 2020-06-08 DIAGNOSIS — D1801 Hemangioma of skin and subcutaneous tissue: Secondary | ICD-10-CM | POA: Diagnosis not present

## 2020-06-08 DIAGNOSIS — Z85828 Personal history of other malignant neoplasm of skin: Secondary | ICD-10-CM | POA: Diagnosis not present

## 2020-06-08 DIAGNOSIS — L821 Other seborrheic keratosis: Secondary | ICD-10-CM | POA: Diagnosis not present

## 2020-06-08 DIAGNOSIS — B351 Tinea unguium: Secondary | ICD-10-CM | POA: Diagnosis not present

## 2020-06-16 DIAGNOSIS — C61 Malignant neoplasm of prostate: Secondary | ICD-10-CM | POA: Diagnosis not present

## 2020-06-27 IMAGING — US US ABDOMEN LIMITED
1 series · 14 of 25 positions shown · non-contrast
Comparison: 04/15/2017, 04/12/2016 and 10/14/2015.

CLINICAL DATA: Follow-up liver probable hemangioma.

EXAM:
ULTRASOUND ABDOMEN LIMITED RIGHT UPPER QUADRANT

[Series 1: us abdomen limited · 0.14mm/px · 14 of 49 slices shown]
[im 1/49]
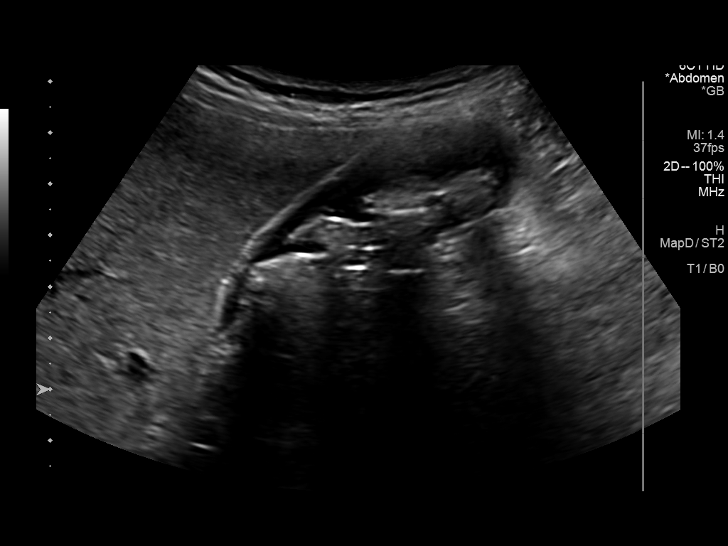
[im 5/49]
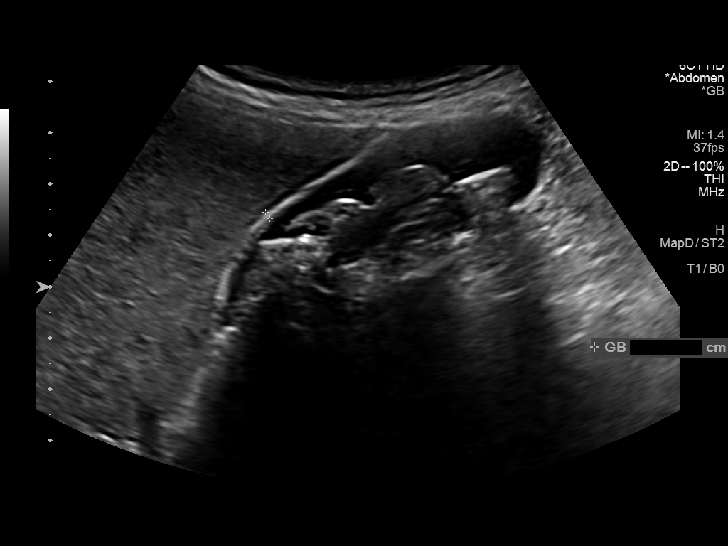
[im 9/49]
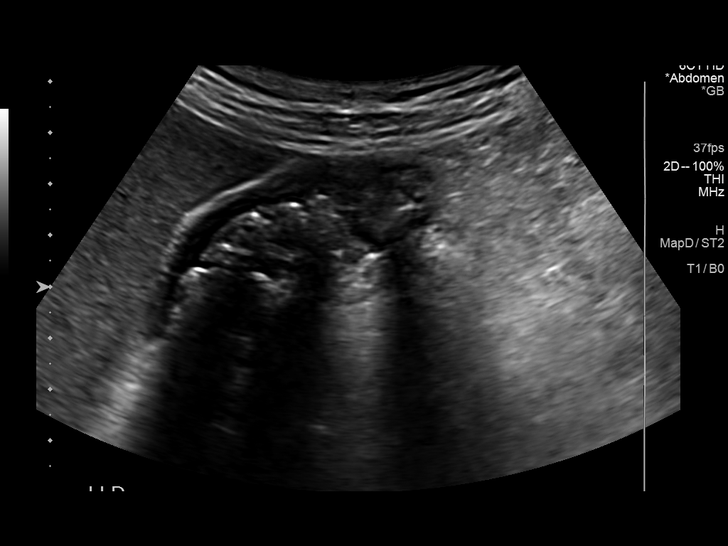
[im 13/49]
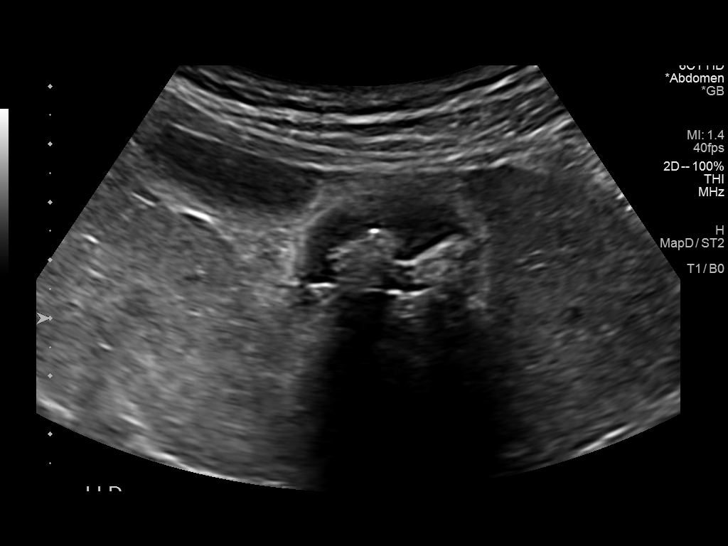
[im 17/49]
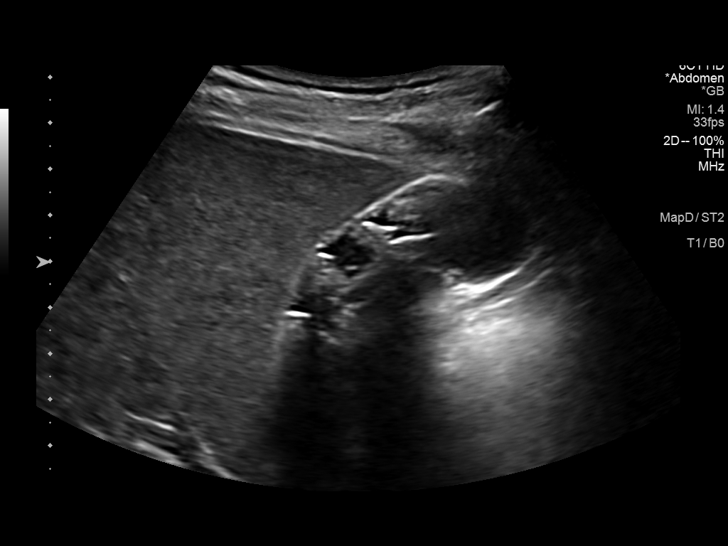
[im 19/49]
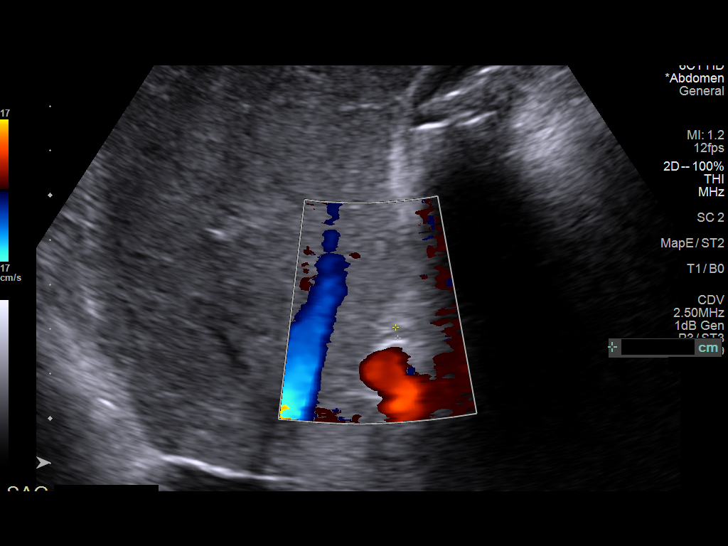
[im 23/49]
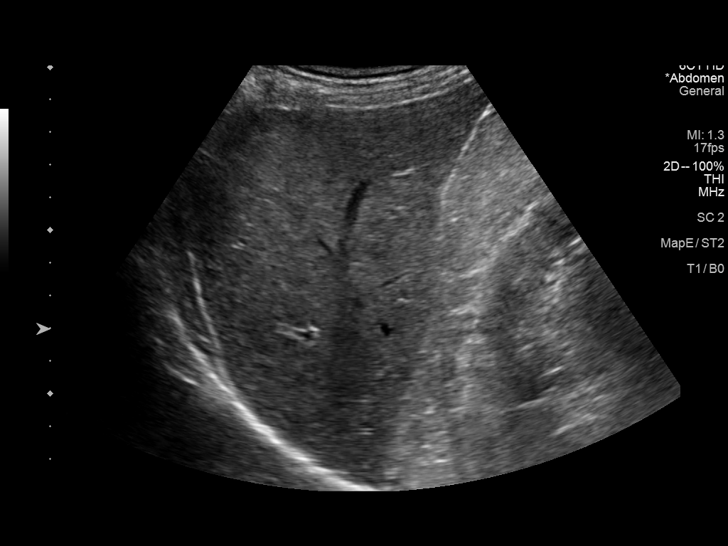
[im 27/49]
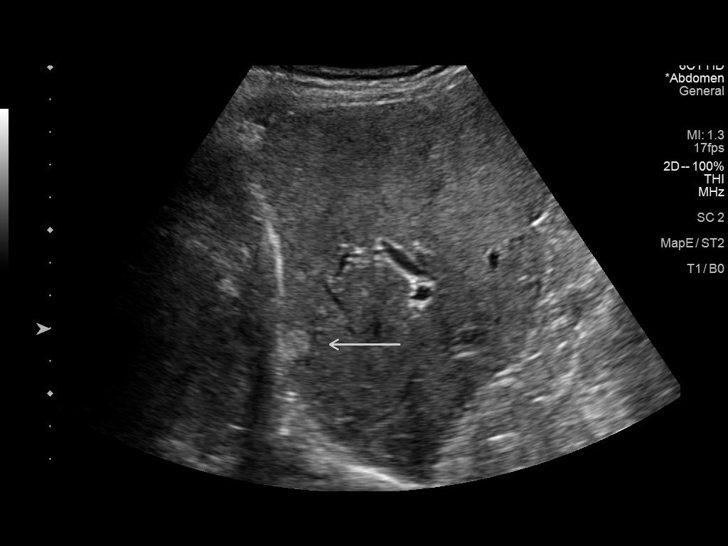
[im 31/49]
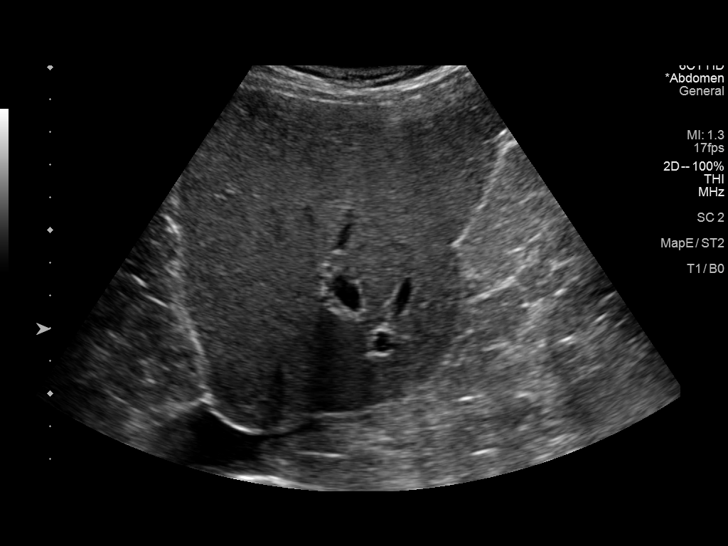
[im 33/49]
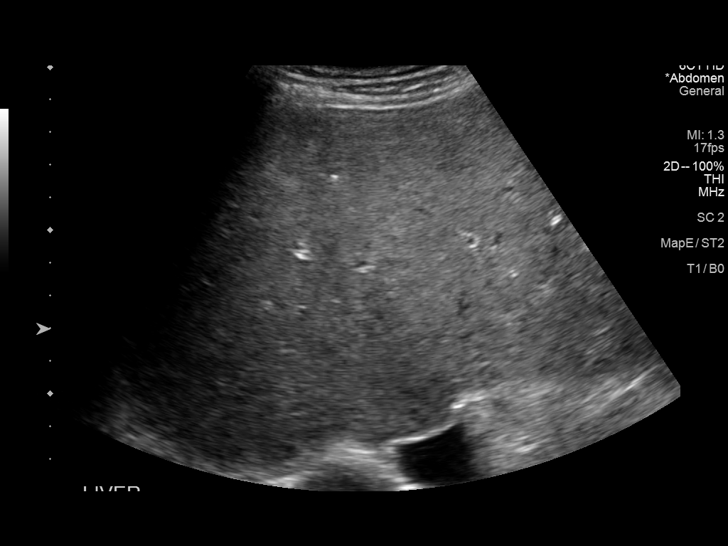
[im 37/49]
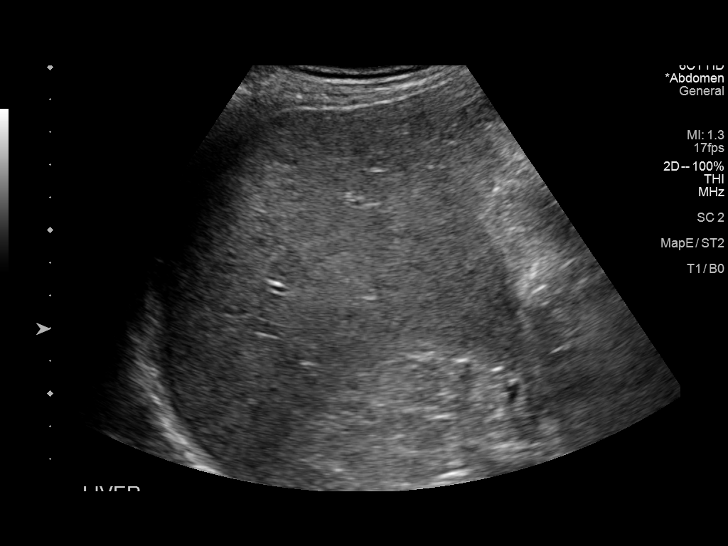
[im 41/49]
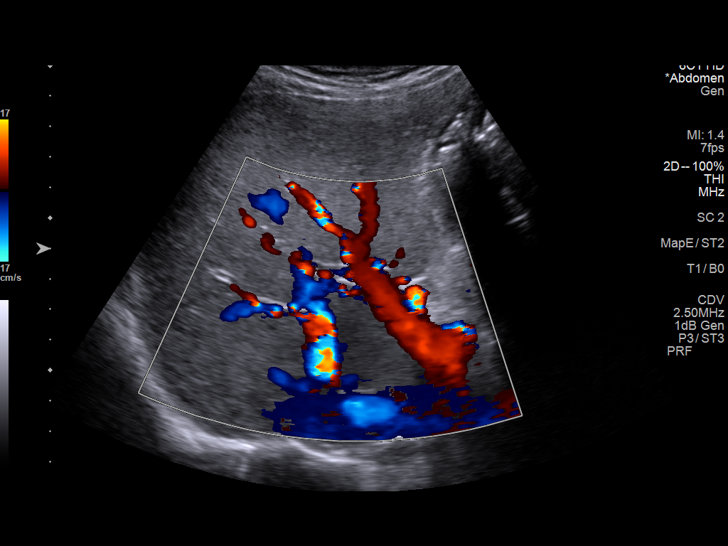
[im 45/49]
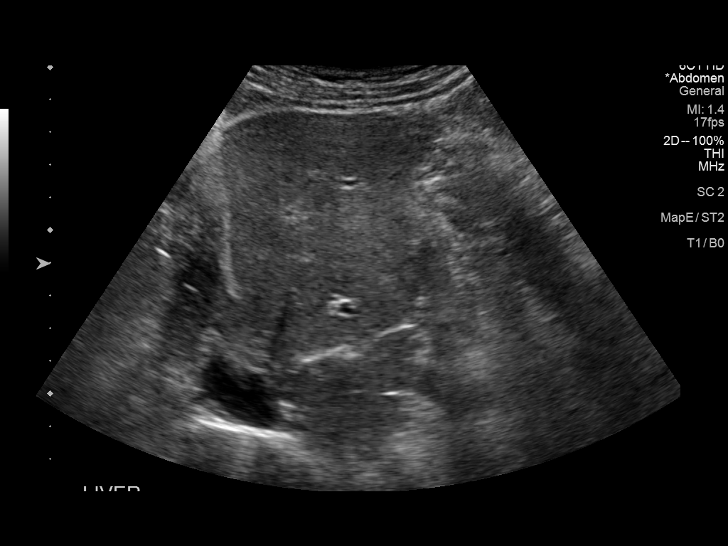
[im 49/49]
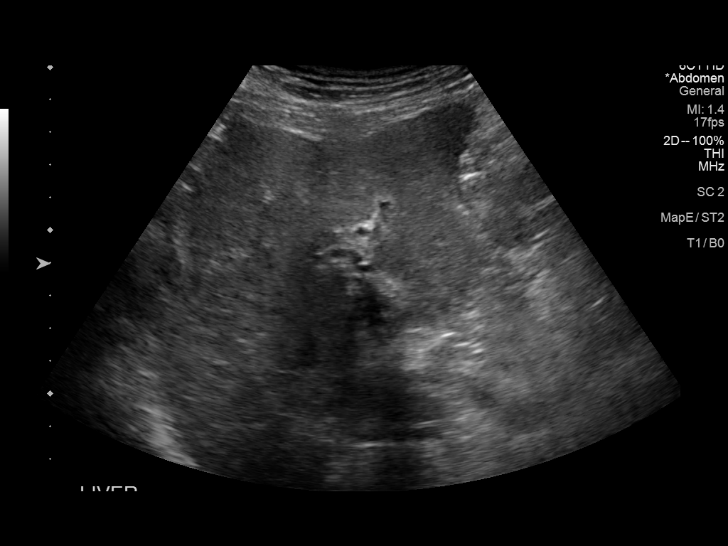

[14 of 25 positions shown; findings below may reference images not displayed]

FINDINGS: Gallbladder:

Multiple gallstones in the gallbladder measuring up to 1.8 cm in
maximum diameter each. No gallbladder wall thickening.

Common bile duct:

Diameter: 2.4 mm

Liver:

Mildly diffusely heterogeneous period 9 x 9 x 9 mm oval, echogenic
mass in the dome of the liver on the right. This measured 11 x 10 x
9 mm initially on 10/14/2015. No new liver masses are seen. Portal
vein is patent on color Doppler imaging with normal direction of
blood flow towards the liver.

Other: None.
IMPRESSION: 1. Interval slight decrease in size of the previously demonstrated
right lobe liver hemangioma. This does not need further follow-up.
2. Mildly heterogeneous liver. This can be seen with heterogeneous
steatosis, chronic hepatitis or cirrhosis.
3. Previously demonstrated cholelithiasis.

## 2020-06-28 DIAGNOSIS — C61 Malignant neoplasm of prostate: Secondary | ICD-10-CM | POA: Diagnosis not present

## 2020-06-28 DIAGNOSIS — N400 Enlarged prostate without lower urinary tract symptoms: Secondary | ICD-10-CM | POA: Diagnosis not present

## 2020-07-04 DIAGNOSIS — C61 Malignant neoplasm of prostate: Secondary | ICD-10-CM | POA: Diagnosis not present

## 2020-07-04 DIAGNOSIS — Z8546 Personal history of malignant neoplasm of prostate: Secondary | ICD-10-CM | POA: Diagnosis not present

## 2020-07-18 DIAGNOSIS — Z85828 Personal history of other malignant neoplasm of skin: Secondary | ICD-10-CM | POA: Diagnosis not present

## 2020-07-18 DIAGNOSIS — C44319 Basal cell carcinoma of skin of other parts of face: Secondary | ICD-10-CM | POA: Diagnosis not present

## 2020-08-08 DIAGNOSIS — Z7952 Long term (current) use of systemic steroids: Secondary | ICD-10-CM | POA: Diagnosis not present

## 2020-08-08 DIAGNOSIS — Z91041 Radiographic dye allergy status: Secondary | ICD-10-CM | POA: Diagnosis not present

## 2020-08-08 DIAGNOSIS — Z888 Allergy status to other drugs, medicaments and biological substances status: Secondary | ICD-10-CM | POA: Diagnosis not present

## 2020-08-08 DIAGNOSIS — Z881 Allergy status to other antibiotic agents status: Secondary | ICD-10-CM | POA: Diagnosis not present

## 2020-08-08 DIAGNOSIS — Z79899 Other long term (current) drug therapy: Secondary | ICD-10-CM | POA: Diagnosis not present

## 2020-08-08 DIAGNOSIS — G250 Essential tremor: Secondary | ICD-10-CM | POA: Diagnosis not present

## 2020-08-08 DIAGNOSIS — G7000 Myasthenia gravis without (acute) exacerbation: Secondary | ICD-10-CM | POA: Diagnosis not present

## 2020-08-10 DIAGNOSIS — Z85828 Personal history of other malignant neoplasm of skin: Secondary | ICD-10-CM | POA: Diagnosis not present

## 2020-08-10 DIAGNOSIS — C4441 Basal cell carcinoma of skin of scalp and neck: Secondary | ICD-10-CM | POA: Diagnosis not present

## 2020-08-10 DIAGNOSIS — L820 Inflamed seborrheic keratosis: Secondary | ICD-10-CM | POA: Diagnosis not present

## 2020-10-31 DIAGNOSIS — M19042 Primary osteoarthritis, left hand: Secondary | ICD-10-CM | POA: Diagnosis not present

## 2020-11-10 DIAGNOSIS — H524 Presbyopia: Secondary | ICD-10-CM | POA: Diagnosis not present

## 2020-12-08 DIAGNOSIS — M5416 Radiculopathy, lumbar region: Secondary | ICD-10-CM | POA: Diagnosis not present

## 2020-12-23 ENCOUNTER — Inpatient Hospital Stay: Payer: Medicare Other | Attending: Oncology | Admitting: Oncology

## 2020-12-23 ENCOUNTER — Inpatient Hospital Stay: Payer: Medicare Other

## 2020-12-23 ENCOUNTER — Other Ambulatory Visit: Payer: Self-pay

## 2020-12-23 VITALS — BP 131/75 | HR 69 | Temp 96.7°F | Resp 16 | Wt 171.1 lb

## 2020-12-23 DIAGNOSIS — Z7952 Long term (current) use of systemic steroids: Secondary | ICD-10-CM | POA: Insufficient documentation

## 2020-12-23 DIAGNOSIS — Z8546 Personal history of malignant neoplasm of prostate: Secondary | ICD-10-CM | POA: Diagnosis not present

## 2020-12-23 DIAGNOSIS — D472 Monoclonal gammopathy: Secondary | ICD-10-CM

## 2020-12-23 DIAGNOSIS — Z79899 Other long term (current) drug therapy: Secondary | ICD-10-CM | POA: Insufficient documentation

## 2020-12-23 LAB — CBC WITH DIFFERENTIAL (CANCER CENTER ONLY)
Abs Immature Granulocytes: 0.06 10*3/uL (ref 0.00–0.07)
Basophils Absolute: 0.1 10*3/uL (ref 0.0–0.1)
Basophils Relative: 1 %
Eosinophils Absolute: 0 10*3/uL (ref 0.0–0.5)
Eosinophils Relative: 0 %
HCT: 44.1 % (ref 39.0–52.0)
Hemoglobin: 14.4 g/dL (ref 13.0–17.0)
Immature Granulocytes: 1 %
Lymphocytes Relative: 9 %
Lymphs Abs: 0.8 10*3/uL (ref 0.7–4.0)
MCH: 31 pg (ref 26.0–34.0)
MCHC: 32.7 g/dL (ref 30.0–36.0)
MCV: 94.8 fL (ref 80.0–100.0)
Monocytes Absolute: 0.7 10*3/uL (ref 0.1–1.0)
Monocytes Relative: 8 %
Neutro Abs: 7.2 10*3/uL (ref 1.7–7.7)
Neutrophils Relative %: 81 %
Platelet Count: 211 10*3/uL (ref 150–400)
RBC: 4.65 MIL/uL (ref 4.22–5.81)
RDW: 13.4 % (ref 11.5–15.5)
WBC Count: 8.9 10*3/uL (ref 4.0–10.5)
nRBC: 0 % (ref 0.0–0.2)

## 2020-12-23 LAB — CMP (CANCER CENTER ONLY)
ALT: 28 U/L (ref 0–44)
AST: 27 U/L (ref 15–41)
Albumin: 3.8 g/dL (ref 3.5–5.0)
Alkaline Phosphatase: 64 U/L (ref 38–126)
Anion gap: 9 (ref 5–15)
BUN: 26 mg/dL — ABNORMAL HIGH (ref 8–23)
CO2: 24 mmol/L (ref 22–32)
Calcium: 9.3 mg/dL (ref 8.9–10.3)
Chloride: 107 mmol/L (ref 98–111)
Creatinine: 1.28 mg/dL — ABNORMAL HIGH (ref 0.61–1.24)
GFR, Estimated: 57 mL/min — ABNORMAL LOW (ref >60)
Glucose, Bld: 146 mg/dL — ABNORMAL HIGH (ref 70–99)
Potassium: 4.4 mmol/L (ref 3.5–5.1)
Sodium: 140 mmol/L (ref 135–145)
Total Bilirubin: 0.8 mg/dL (ref 0.3–1.2)
Total Protein: 6.2 g/dL — ABNORMAL LOW (ref 6.5–8.1)

## 2020-12-23 NOTE — Progress Notes (Signed)
Hematology and Oncology Follow Up Visit  Reginald Ford [id number] [birth year] 80 y.o. 12/23/2020 12:59 PM [name], MDHolwerda, [name], MD   Principle Diagnosis: 80 year old man with IgM lambda MGUS diagnosed in 2014.  He presented with M spike of 0.2 g/dL without any evidence of symptomatic multiple myeloma.  Initially he was found to have IgG subtype while living in [place].     Current therapy: Active surveillance.  Interim History: Mr. Reginald Ford returns today for a follow-up visit.  Since the last visit, he reports feeling well without any major complaints.  He denies any chest pain or shortness of breath.  He denies any recent pathological fractures or falls.  Continues to be active and attends activities of daily living.  He denies any neurological deficits or weakness.  He denies any visual changes.  He continues to be on prednisone for his myasthenia without any recent exacerbation.     Medications: I have reviewed the patient's current medications.  Current Outpatient Medications  Medication Sig Dispense Refill  . acetaminophen (TYLENOL) 500 MG tablet Take 1,000 mg by mouth every 6 (six) hours as needed (for pain.).    . Alpha-Lipoic Acid 300 MG TABS Take 300 mg by mouth daily.    . B Complex Vitamins (B COMPLEX 50 PO) Take 1 tablet by mouth 2 (two) times daily.    Marland Kitchen CALCIUM CITRATE PO Take 1,000 mg by mouth 2 (two) times daily.    . Cholecalciferol (VITAMIN D3) 2000 units TABS Take 2,000 Units by mouth 2 (two) times daily.    . Coenzyme Q10 (COQ10) 200 MG CAPS Take 200 mg by mouth daily.    . Digestive Enzymes (DIGESTIVE ENZYME PO) Take 1 capsule by mouth 2 (two) times daily.    . Magnesium Bisglycinate (MAG GLYCINATE PO) Take 400 mg by mouth 2 (two) times daily.    . Multiple Vitamin (MULTIVITAMIN WITH MINERALS) TABS tablet Take 1 tablet by mouth daily.    Marland Kitchen omeprazole (PRILOSEC) 20 MG capsule Take 20 mg by mouth daily before breakfast.     . predniSONE (DELTASONE)  10 MG tablet Take 5-20 mg by mouth See admin instructions. Typically takes 5 mg by mouth daily but is taking 20 mg by mouth daily for  a week before and after surgery  3  . Probiotic Product (PROBIOTIC PO) Take 1 capsule by mouth 2 (two) times daily.    Marland Kitchen pyridostigmine (MESTINON) 60 MG tablet Take 120 mg by mouth 3 (three) times daily. Morning, Lunch, & supper    . Saw Palmetto, Serenoa repens, 320 MG CAPS Take 320 mg by mouth daily.    . vitamin C (ASCORBIC ACID) 500 MG tablet Take 500 mg by mouth daily.     No current facility-administered medications for this visit.     Allergies:  Allergies  Allergen Reactions  . Aminoglycosides Other (See Comments)    Medication may cause weakness from myasthenia gravis  . Beta Adrenergic Blockers Other (See Comments)    Medication may cause weakness from myasthenia gravis  . Botulinum Toxins Other (See Comments)    Medication may cause weakness from myasthenia gravis  . Calcium Channel Blockers Other (See Comments)    Medication may cause weakness from myasthenia gravis  . Curare [Tubocurarine] Other (See Comments)    Medication may cause weakness from myasthenia gravis  . Interferons Other (See Comments)    Medication may cause weakness from myasthenia gravis  . Iodinated Diagnostic Agents Other (See Comments)  Medication may cause weakness from myasthenia gravis  . Lithium Other (See Comments)    Medication may cause Weakness from myasthenia gravis  . Macrolides And Ketolides Other (See Comments)    Medication may cause weakness from myastenia gravis  . Other Other (See Comments)    Magnesium salts, Fluoroquinolones  . Penicillamine Other (See Comments)    Medication may cause weakness from myasthenia gravis  . Quinine Derivatives Other (See Comments)    Medication may cause weakness from myastenia gravis  . Statins Other (See Comments)    Medication may cause weakness from myasthenia gravis      Physical Exam: Blood pressure  131/75, pulse 69, temperature (!) 96.7 F (35.9 C), temperature source Tympanic, resp. rate 16, weight 171 lb 1.6 oz (77.6 kg), SpO2 97 %.  ECOG: 0   General appearance: Comfortable appearing without any discomfort Head: Normocephalic without any trauma Oropharynx: Mucous membranes are moist and pink without any thrush or ulcers. Eyes: Pupils are equal and round reactive to light. Lymph nodes: No cervical, supraclavicular, inguinal or axillary lymphadenopathy.   Heart:regular rate and rhythm.  S1 and S2 without leg edema. Lung: Clear without any rhonchi or wheezes.  No dullness to percussion. Abdomin: Soft, nontender, nondistended with good bowel sounds.  No hepatosplenomegaly. Musculoskeletal: No joint deformity or effusion.  Full range of motion noted. Neurological: No deficits noted on motor, sensory and deep tendon reflex exam. Skin: No petechial rash or dryness.  Appeared moist.     Lab Results: Lab Results  Component Value Date   WBC 8.3 12/24/2019   HGB 14.4 12/24/2019   HCT 44.3 12/24/2019   MCV 95.3 12/24/2019   PLT 197 12/24/2019     Chemistry      Component Value Date/Time   NA 142 12/24/2019 1452   K 4.4 12/24/2019 1452   CL 107 12/24/2019 1452   CO2 28 12/24/2019 1452   BUN 30 (H) 12/24/2019 1452   CREATININE 1.25 (H) 12/24/2019 1452      Component Value Date/Time   CALCIUM 9.0 12/24/2019 1452   ALKPHOS 49 12/24/2019 1452   AST 25 12/24/2019 1452   ALT 30 12/24/2019 1452   BILITOT 0.6 12/24/2019 1452     Results for Reginald, Ford (MRN [id number]) as of 12/23/2020 13:01  Ref. Range 12/24/2019 14:52  M Protein SerPl Elph-Mcnc Latest Ref Range: Not Observed g/dL 0.2 (H)  IFE 1 Unknown Comment (A)  Globulin, Total Latest Ref Range: 2.2 - 3.9 g/dL 2.2  B-Globulin SerPl Elph-Mcnc Latest Ref Range: 0.7 - 1.3 g/dL 0.7  IgG (Immunoglobin G), Serum Latest Ref Range: 603 - 1,613 mg/dL 530 (L)  IgM (Immunoglobulin M), Srm Latest Ref Range: 15 - 143 mg/dL 179  (H)  IgA Latest Ref Range: 61 - 437 mg/dL 165      Impression and Plan:  80 year old man with:  1.  IgM lambda monoclonal gammopathy noted since 2014.  His M spike was 0.2 g/dL MGUS diagnosed in 2014.    Protein studies obtained in May 2021 were personally reviewed and discussed with the patient.  Continues to have very low M spike with mild elevation in his IgM and depressed IgG level.  Serum light chains showed mild elevation in the kappa to lambda ratio but overall normal light chain findings.  The differential diagnosis for these findings were discussed at this time.  MGUS versus a low-grade lymphoproliferative disorder versus a reactive findings related to his autoimmune disease could be also a consideration.  At this time I see no evidence of symptomatic plasma cell disorder or lymphoproliferative disorder.  I have recommended continued annual surveillance at this time.  Indication for further evaluation and treatment would include endorgan damage occluding hypercalcemia, renal failure, lymphadenopathy among others.  He is agreeable to continue with active surveillance.   2.   T1c Gleason score 3+3 = 6 prostate cancer diagnosed in March 2021.  He is currently on active surveillance under the care of [facility].    3.  Follow-up: In 12 months for repeat follow-up.   30  minutes were spent on this encounter.  Time was dedicated to updating his disease status, reviewing laboratory data, discussing complications related to this condition for the future.   Zola Button, MD 5/27/202212:59 PM

## 2020-12-27 LAB — KAPPA/LAMBDA LIGHT CHAINS
Kappa free light chain: 21.3 mg/L — ABNORMAL HIGH (ref 3.3–19.4)
Kappa, lambda light chain ratio: 1.4 (ref 0.26–1.65)
Lambda free light chains: 15.2 mg/L (ref 5.7–26.3)

## 2020-12-28 DIAGNOSIS — Z8546 Personal history of malignant neoplasm of prostate: Secondary | ICD-10-CM | POA: Diagnosis not present

## 2020-12-28 LAB — MULTIPLE MYELOMA PANEL, SERUM
Albumin SerPl Elph-Mcnc: 3.7 g/dL (ref 2.9–4.4)
Albumin/Glob SerPl: 1.6 (ref 0.7–1.7)
Alpha 1: 0.2 g/dL (ref 0.0–0.4)
Alpha2 Glob SerPl Elph-Mcnc: 0.7 g/dL (ref 0.4–1.0)
B-Globulin SerPl Elph-Mcnc: 0.7 g/dL (ref 0.7–1.3)
Gamma Glob SerPl Elph-Mcnc: 0.8 g/dL (ref 0.4–1.8)
Globulin, Total: 2.4 g/dL (ref 2.2–3.9)
IgA: 181 mg/dL (ref 61–437)
IgG (Immunoglobin G), Serum: 614 mg/dL (ref 603–1613)
IgM (Immunoglobulin M), Srm: 203 mg/dL — ABNORMAL HIGH (ref 15–143)
M Protein SerPl Elph-Mcnc: 0.2 g/dL — ABNORMAL HIGH
Total Protein ELP: 6.1 g/dL (ref 6.0–8.5)

## 2020-12-29 ENCOUNTER — Encounter: Payer: Self-pay | Admitting: Oncology

## 2021-01-02 DIAGNOSIS — C61 Malignant neoplasm of prostate: Secondary | ICD-10-CM | POA: Diagnosis not present

## 2021-02-07 DIAGNOSIS — B351 Tinea unguium: Secondary | ICD-10-CM | POA: Diagnosis not present

## 2021-02-07 DIAGNOSIS — Z85828 Personal history of other malignant neoplasm of skin: Secondary | ICD-10-CM | POA: Diagnosis not present

## 2021-02-07 DIAGNOSIS — D2271 Melanocytic nevi of right lower limb, including hip: Secondary | ICD-10-CM | POA: Diagnosis not present

## 2021-02-07 DIAGNOSIS — C4441 Basal cell carcinoma of skin of scalp and neck: Secondary | ICD-10-CM | POA: Diagnosis not present

## 2021-02-07 DIAGNOSIS — L814 Other melanin hyperpigmentation: Secondary | ICD-10-CM | POA: Diagnosis not present

## 2021-03-02 DIAGNOSIS — H532 Diplopia: Secondary | ICD-10-CM | POA: Diagnosis not present

## 2021-03-02 DIAGNOSIS — Z7952 Long term (current) use of systemic steroids: Secondary | ICD-10-CM | POA: Diagnosis not present

## 2021-03-02 DIAGNOSIS — Z79899 Other long term (current) drug therapy: Secondary | ICD-10-CM | POA: Diagnosis not present

## 2021-03-02 DIAGNOSIS — G250 Essential tremor: Secondary | ICD-10-CM | POA: Diagnosis not present

## 2021-03-02 DIAGNOSIS — G7000 Myasthenia gravis without (acute) exacerbation: Secondary | ICD-10-CM | POA: Diagnosis not present

## 2021-03-20 DIAGNOSIS — C4441 Basal cell carcinoma of skin of scalp and neck: Secondary | ICD-10-CM | POA: Diagnosis not present

## 2021-06-09 DIAGNOSIS — R972 Elevated prostate specific antigen [PSA]: Secondary | ICD-10-CM | POA: Diagnosis not present

## 2021-06-09 DIAGNOSIS — M8580 Other specified disorders of bone density and structure, unspecified site: Secondary | ICD-10-CM | POA: Diagnosis not present

## 2021-06-09 DIAGNOSIS — R5383 Other fatigue: Secondary | ICD-10-CM | POA: Diagnosis not present

## 2021-06-09 DIAGNOSIS — N183 Chronic kidney disease, stage 3 unspecified: Secondary | ICD-10-CM | POA: Diagnosis not present

## 2021-06-14 DIAGNOSIS — D225 Melanocytic nevi of trunk: Secondary | ICD-10-CM | POA: Diagnosis not present

## 2021-06-14 DIAGNOSIS — Z85828 Personal history of other malignant neoplasm of skin: Secondary | ICD-10-CM | POA: Diagnosis not present

## 2021-06-14 DIAGNOSIS — L918 Other hypertrophic disorders of the skin: Secondary | ICD-10-CM | POA: Diagnosis not present

## 2021-06-14 DIAGNOSIS — L570 Actinic keratosis: Secondary | ICD-10-CM | POA: Diagnosis not present

## 2021-06-16 DIAGNOSIS — N183 Chronic kidney disease, stage 3 unspecified: Secondary | ICD-10-CM | POA: Diagnosis not present

## 2021-06-16 DIAGNOSIS — R82998 Other abnormal findings in urine: Secondary | ICD-10-CM | POA: Diagnosis not present

## 2021-07-04 DIAGNOSIS — C61 Malignant neoplasm of prostate: Secondary | ICD-10-CM | POA: Diagnosis not present

## 2021-07-10 DIAGNOSIS — C61 Malignant neoplasm of prostate: Secondary | ICD-10-CM | POA: Diagnosis not present

## 2021-07-26 ENCOUNTER — Telehealth: Payer: Self-pay | Admitting: Oncology

## 2021-07-26 NOTE — Telephone Encounter (Signed)
Called patient regarding upcoming appointments, left a voicemail. 

## 2021-09-07 DIAGNOSIS — M542 Cervicalgia: Secondary | ICD-10-CM | POA: Diagnosis not present

## 2021-09-07 DIAGNOSIS — I10 Essential (primary) hypertension: Secondary | ICD-10-CM | POA: Diagnosis not present

## 2021-10-05 DIAGNOSIS — M5412 Radiculopathy, cervical region: Secondary | ICD-10-CM | POA: Diagnosis not present

## 2021-10-30 DIAGNOSIS — M19042 Primary osteoarthritis, left hand: Secondary | ICD-10-CM | POA: Diagnosis not present

## 2021-10-31 DIAGNOSIS — R600 Localized edema: Secondary | ICD-10-CM | POA: Diagnosis not present

## 2021-10-31 DIAGNOSIS — I739 Peripheral vascular disease, unspecified: Secondary | ICD-10-CM | POA: Diagnosis not present

## 2021-11-09 DIAGNOSIS — M542 Cervicalgia: Secondary | ICD-10-CM | POA: Diagnosis not present

## 2021-11-09 DIAGNOSIS — Z6825 Body mass index (BMI) 25.0-25.9, adult: Secondary | ICD-10-CM | POA: Diagnosis not present

## 2021-11-10 ENCOUNTER — Ambulatory Visit (HOSPITAL_COMMUNITY)
Admission: RE | Admit: 2021-11-10 | Discharge: 2021-11-10 | Disposition: A | Discharge status: Home / Self Care | Payer: Medicare Other | Source: Ambulatory Visit | Attending: Family Medicine | Admitting: Family Medicine

## 2021-11-10 ENCOUNTER — Other Ambulatory Visit (HOSPITAL_COMMUNITY): Payer: Self-pay | Admitting: Internal Medicine

## 2021-11-10 DIAGNOSIS — I739 Peripheral vascular disease, unspecified: Secondary | ICD-10-CM | POA: Diagnosis not present

## 2021-11-14 DIAGNOSIS — M2578 Osteophyte, vertebrae: Secondary | ICD-10-CM | POA: Diagnosis not present

## 2021-11-14 DIAGNOSIS — M542 Cervicalgia: Secondary | ICD-10-CM | POA: Diagnosis not present

## 2021-11-16 DIAGNOSIS — M542 Cervicalgia: Secondary | ICD-10-CM | POA: Diagnosis not present

## 2021-11-20 DIAGNOSIS — H524 Presbyopia: Secondary | ICD-10-CM | POA: Diagnosis not present

## 2021-11-20 DIAGNOSIS — M542 Cervicalgia: Secondary | ICD-10-CM | POA: Diagnosis not present

## 2021-11-23 DIAGNOSIS — M542 Cervicalgia: Secondary | ICD-10-CM | POA: Diagnosis not present

## 2021-11-28 ENCOUNTER — Other Ambulatory Visit: Payer: Self-pay | Admitting: Oncology

## 2021-11-28 ENCOUNTER — Encounter: Payer: Self-pay | Admitting: Oncology

## 2021-11-28 DIAGNOSIS — D472 Monoclonal gammopathy: Secondary | ICD-10-CM

## 2021-11-28 DIAGNOSIS — C61 Malignant neoplasm of prostate: Secondary | ICD-10-CM

## 2021-12-06 DIAGNOSIS — M542 Cervicalgia: Secondary | ICD-10-CM | POA: Diagnosis not present

## 2021-12-07 DIAGNOSIS — Z79899 Other long term (current) drug therapy: Secondary | ICD-10-CM | POA: Diagnosis not present

## 2021-12-07 DIAGNOSIS — G250 Essential tremor: Secondary | ICD-10-CM | POA: Diagnosis not present

## 2021-12-07 DIAGNOSIS — G7000 Myasthenia gravis without (acute) exacerbation: Secondary | ICD-10-CM | POA: Diagnosis not present

## 2021-12-07 DIAGNOSIS — Z7952 Long term (current) use of systemic steroids: Secondary | ICD-10-CM | POA: Diagnosis not present

## 2021-12-07 DIAGNOSIS — Z888 Allergy status to other drugs, medicaments and biological substances status: Secondary | ICD-10-CM | POA: Diagnosis not present

## 2021-12-07 DIAGNOSIS — Z881 Allergy status to other antibiotic agents status: Secondary | ICD-10-CM | POA: Diagnosis not present

## 2021-12-08 DIAGNOSIS — M542 Cervicalgia: Secondary | ICD-10-CM | POA: Diagnosis not present

## 2021-12-11 DIAGNOSIS — M8589 Other specified disorders of bone density and structure, multiple sites: Secondary | ICD-10-CM | POA: Diagnosis not present

## 2021-12-11 DIAGNOSIS — M542 Cervicalgia: Secondary | ICD-10-CM | POA: Diagnosis not present

## 2021-12-13 DIAGNOSIS — D1801 Hemangioma of skin and subcutaneous tissue: Secondary | ICD-10-CM | POA: Diagnosis not present

## 2021-12-13 DIAGNOSIS — L918 Other hypertrophic disorders of the skin: Secondary | ICD-10-CM | POA: Diagnosis not present

## 2021-12-13 DIAGNOSIS — M542 Cervicalgia: Secondary | ICD-10-CM | POA: Diagnosis not present

## 2021-12-13 DIAGNOSIS — L821 Other seborrheic keratosis: Secondary | ICD-10-CM | POA: Diagnosis not present

## 2021-12-13 DIAGNOSIS — Z85828 Personal history of other malignant neoplasm of skin: Secondary | ICD-10-CM | POA: Diagnosis not present

## 2021-12-15 ENCOUNTER — Other Ambulatory Visit: Payer: Self-pay

## 2021-12-15 ENCOUNTER — Inpatient Hospital Stay: Payer: Medicare Other | Attending: Oncology

## 2021-12-15 DIAGNOSIS — C61 Malignant neoplasm of prostate: Secondary | ICD-10-CM

## 2021-12-15 DIAGNOSIS — Z8546 Personal history of malignant neoplasm of prostate: Secondary | ICD-10-CM | POA: Insufficient documentation

## 2021-12-15 DIAGNOSIS — D472 Monoclonal gammopathy: Secondary | ICD-10-CM | POA: Insufficient documentation

## 2021-12-15 LAB — CBC WITH DIFFERENTIAL (CANCER CENTER ONLY)
Abs Immature Granulocytes: 0.05 10*3/uL (ref 0.00–0.07)
Basophils Absolute: 0.1 10*3/uL (ref 0.0–0.1)
Basophils Relative: 1 %
Eosinophils Absolute: 0.3 10*3/uL (ref 0.0–0.5)
Eosinophils Relative: 4 %
HCT: 44.4 % (ref 39.0–52.0)
Hemoglobin: 14.7 g/dL (ref 13.0–17.0)
Immature Granulocytes: 1 %
Lymphocytes Relative: 17 %
Lymphs Abs: 1.3 10*3/uL (ref 0.7–4.0)
MCH: 30.6 pg (ref 26.0–34.0)
MCHC: 33.1 g/dL (ref 30.0–36.0)
MCV: 92.5 fL (ref 80.0–100.0)
Monocytes Absolute: 1 10*3/uL (ref 0.1–1.0)
Monocytes Relative: 13 %
Neutro Abs: 4.8 10*3/uL (ref 1.7–7.7)
Neutrophils Relative %: 64 %
Platelet Count: 221 10*3/uL (ref 150–400)
RBC: 4.8 MIL/uL (ref 4.22–5.81)
RDW: 13.3 % (ref 11.5–15.5)
WBC Count: 7.5 10*3/uL (ref 4.0–10.5)
nRBC: 0 % (ref 0.0–0.2)

## 2021-12-15 LAB — CMP (CANCER CENTER ONLY)
ALT: 23 U/L (ref 0–44)
AST: 24 U/L (ref 15–41)
Albumin: 3.9 g/dL (ref 3.5–5.0)
Alkaline Phosphatase: 51 U/L (ref 38–126)
Anion gap: 3 — ABNORMAL LOW (ref 5–15)
BUN: 31 mg/dL — ABNORMAL HIGH (ref 8–23)
CO2: 30 mmol/L (ref 22–32)
Calcium: 9.3 mg/dL (ref 8.9–10.3)
Chloride: 106 mmol/L (ref 98–111)
Creatinine: 1.38 mg/dL — ABNORMAL HIGH (ref 0.61–1.24)
GFR, Estimated: 52 mL/min — ABNORMAL LOW (ref >60)
Glucose, Bld: 87 mg/dL (ref 70–99)
Potassium: 4.3 mmol/L (ref 3.5–5.1)
Sodium: 139 mmol/L (ref 135–145)
Total Bilirubin: 1.2 mg/dL (ref 0.3–1.2)
Total Protein: 6.4 g/dL — ABNORMAL LOW (ref 6.5–8.1)

## 2021-12-16 LAB — PROSTATE-SPECIFIC AG, SERUM (LABCORP): Prostate Specific Ag, Serum: 6.7 ng/mL — ABNORMAL HIGH (ref 0.0–4.0)

## 2021-12-18 DIAGNOSIS — M542 Cervicalgia: Secondary | ICD-10-CM | POA: Diagnosis not present

## 2021-12-18 LAB — KAPPA/LAMBDA LIGHT CHAINS
Kappa free light chain: 24.2 mg/L — ABNORMAL HIGH (ref 3.3–19.4)
Kappa, lambda light chain ratio: 1.67 — ABNORMAL HIGH (ref 0.26–1.65)
Lambda free light chains: 14.5 mg/L (ref 5.7–26.3)

## 2021-12-20 DIAGNOSIS — M542 Cervicalgia: Secondary | ICD-10-CM | POA: Diagnosis not present

## 2021-12-20 LAB — MULTIPLE MYELOMA PANEL, SERUM
Albumin SerPl Elph-Mcnc: 3.7 g/dL (ref 2.9–4.4)
Albumin/Glob SerPl: 1.7 (ref 0.7–1.7)
Alpha 1: 0.1 g/dL (ref 0.0–0.4)
Alpha2 Glob SerPl Elph-Mcnc: 0.7 g/dL (ref 0.4–1.0)
B-Globulin SerPl Elph-Mcnc: 0.7 g/dL (ref 0.7–1.3)
Gamma Glob SerPl Elph-Mcnc: 0.7 g/dL (ref 0.4–1.8)
Globulin, Total: 2.3 g/dL (ref 2.2–3.9)
IgA: 159 mg/dL (ref 61–437)
IgG (Immunoglobin G), Serum: 585 mg/dL — ABNORMAL LOW (ref 603–1613)
IgM (Immunoglobulin M), Srm: 213 mg/dL — ABNORMAL HIGH (ref 15–143)
M Protein SerPl Elph-Mcnc: 0.4 g/dL — ABNORMAL HIGH
Total Protein ELP: 6 g/dL (ref 6.0–8.5)

## 2021-12-22 ENCOUNTER — Inpatient Hospital Stay (HOSPITAL_BASED_OUTPATIENT_CLINIC_OR_DEPARTMENT_OTHER): Payer: Medicare Other | Admitting: Oncology

## 2021-12-22 VITALS — BP 127/68 | HR 60 | Temp 97.7°F | Resp 16 | Ht 67.0 in | Wt 171.4 lb

## 2021-12-22 DIAGNOSIS — D472 Monoclonal gammopathy: Secondary | ICD-10-CM

## 2021-12-22 DIAGNOSIS — Z8546 Personal history of malignant neoplasm of prostate: Secondary | ICD-10-CM | POA: Diagnosis not present

## 2021-12-22 NOTE — Progress Notes (Signed)
Hematology and Oncology Follow Up Visit  Reginald Ford [id number] Mar 04, 1941 81 y.o. 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       Current therapy: Active surveillance.  Interim History: Reginald Ford returns today for a follow-up visit.  Since the last visit, he reports no major changes in his health.  He has reported few complaints including slight fatigue and bilateral lower extremity edema otherwise no other concerns.  He denies any chest pain, shortness of breath or difficulty breathing.  He denies any hospitalizations or illnesses.     Medications: Updated on review. Current Outpatient Medications  Medication Sig Dispense Refill   acetaminophen (TYLENOL) 500 MG tablet Take 1,000 mg by mouth every 6 (six) hours as needed (for pain.).     Alpha-Lipoic Acid 300 MG TABS Take 300 mg by mouth daily.     B Complex Vitamins (B COMPLEX 50 PO) Take 1 tablet by mouth 2 (two) times daily.     CALCIUM CITRATE PO Take 1,000 mg by mouth 2 (two) times daily.     Cholecalciferol (VITAMIN D3) 2000 units TABS Take 2,000 Units by mouth 2 (two) times daily.     Coenzyme Q10 (COQ10) 200 MG CAPS Take 200 mg by mouth daily.     Digestive Enzymes (DIGESTIVE ENZYME PO) Take 1 capsule by mouth 2 (two) times daily.     Magnesium Bisglycinate (MAG GLYCINATE PO) Take 400 mg by mouth 2 (two) times daily.     Multiple Vitamin (MULTIVITAMIN WITH MINERALS) TABS tablet Take 1 tablet by mouth daily.     omeprazole (PRILOSEC) 20 MG capsule Take 20 mg by mouth daily before breakfast.      predniSONE (DELTASONE) 10 MG tablet Take 5-20 mg by mouth See admin instructions. Typically takes 5 mg by mouth daily but is taking 20 mg by mouth daily for  a week before and after surgery  3   Probiotic Product (PROBIOTIC PO) Take 1 capsule by mouth  2 (two) times daily.     pyridostigmine (MESTINON) 60 MG tablet Take 120 mg by mouth 3 (three) times daily. Morning, Lunch, & supper     Saw Palmetto, Serenoa repens, 320 MG CAPS Take 320 mg by mouth daily.     vitamin C (ASCORBIC ACID) 500 MG tablet Take 500 mg by mouth daily.     No current facility-administered medications for this visit.     Allergies:  Allergies  Allergen Reactions   Aminoglycosides Other (See Comments)    Medication may cause weakness from myasthenia gravis   Beta Adrenergic Blockers Other (See Comments)    Medication may cause weakness from myasthenia gravis   Botulinum Toxins Other (See Comments)    Medication may cause weakness from myasthenia gravis   Calcium Channel Blockers Other (See Comments)    Medication may cause weakness from myasthenia gravis   Curare [Tubocurarine] Other (See Comments)    Medication may cause weakness from myasthenia gravis   Interferons Other (See Comments)    Medication may cause weakness from myasthenia gravis   Iodinated Contrast Media Other (See Comments)    Medication may cause weakness from myasthenia gravis   Lithium Other (See Comments)    Medication may cause Weakness from myasthenia gravis   Macrolides And Ketolides Other (See Comments)    Medication may cause weakness from myastenia gravis  Other Other (See Comments)    Magnesium salts, Fluoroquinolones   Penicillamine Other (See Comments)    Medication may cause weakness from myasthenia gravis   Quinine Derivatives Other (See Comments)    Medication may cause weakness from myastenia gravis   Statins Other (See Comments)    Medication may cause weakness from myasthenia gravis      Physical Exam: Blood pressure 127/68, pulse 60, temperature 97.7 F (36.5 C), temperature source Temporal, resp. rate 16, height '5\' 7"'$  (1.702 m), weight 171 lb 6.4 oz (77.7 kg), SpO2 97 %.   ECOG: 0   General appearance: Alert, awake without any distress. Head: Atraumatic  without abnormalities Oropharynx: Without any thrush or ulcers. Eyes: No scleral icterus. Lymph nodes: No lymphadenopathy noted in the cervical, supraclavicular, or axillary nodes Heart:regular rate and rhythm, without any murmurs or gallops.   Lung: Clear to auscultation without any rhonchi, wheezes or dullness to percussion. Abdomin: Soft, nontender without any shifting dullness or ascites. Musculoskeletal: No clubbing or cyanosis. Neurological: No motor or sensory deficits. Skin: No rashes or lesions.    Lab Results: Lab Results  Component Value Date   WBC 7.5 12/15/2021   HGB 14.7 12/15/2021   HCT 44.4 12/15/2021   MCV 92.5 12/15/2021   PLT 221 12/15/2021     Chemistry      Component Value Date/Time   NA 139 12/15/2021 1051   K 4.3 12/15/2021 1051   CL 106 12/15/2021 1051   CO2 30 12/15/2021 1051   BUN 31 (H) 12/15/2021 1051   CREATININE 1.38 (H) 12/15/2021 1051      Component Value Date/Time   CALCIUM 9.3 12/15/2021 1051   ALKPHOS 51 12/15/2021 1051   AST 24 12/15/2021 1051   ALT 23 12/15/2021 1051   BILITOT 1.2 12/15/2021 1051          Impression and Plan:  81 year old man with:   1.  IgM lambda monoclonal gammopathy since 2014.  He has no evidence of endorgan damage to suggest lymphoproliferative disorder or plasma cell disorder.  Laboratory data obtained on Dec 15, 2021 were personally reviewed.  He has a normal CBC with slight elevation in his IgM level but overall stable findings.  I recommended continued annual surveillance at this time and repeat laboratory testing in 12 months.  I see no evidence to suggest lymphoproliferative disorder or plasma cell disorder.  I recommended continued annual surveillance at this time.   2.   Prostate cancer diagnosed in 2021.  He was found to have T1c Gleason score 3+3 = 6 and currently on active surveillance.  His PSA on Dec 15, 2021 was 6.7 and previously was 5.8 in 2022.  Continue to follow with urology at [facility].      3.  Follow-up: He will return in 12 months for repeat evaluation.    30  minutes were spent on this visit.  The time was dedicated to reviewing disease status, laboratory data review treatment choices and future plan of care discussion.   Zola Button, MD 5/26/20239:38 AM

## 2021-12-26 DIAGNOSIS — M542 Cervicalgia: Secondary | ICD-10-CM | POA: Diagnosis not present

## 2022-01-01 DIAGNOSIS — C61 Malignant neoplasm of prostate: Secondary | ICD-10-CM | POA: Diagnosis not present

## 2022-01-02 DIAGNOSIS — M542 Cervicalgia: Secondary | ICD-10-CM | POA: Diagnosis not present

## 2022-01-08 DIAGNOSIS — C61 Malignant neoplasm of prostate: Secondary | ICD-10-CM | POA: Diagnosis not present

## 2022-01-09 DIAGNOSIS — M542 Cervicalgia: Secondary | ICD-10-CM | POA: Diagnosis not present

## 2022-01-16 DIAGNOSIS — M542 Cervicalgia: Secondary | ICD-10-CM | POA: Diagnosis not present

## 2022-01-23 DIAGNOSIS — M542 Cervicalgia: Secondary | ICD-10-CM | POA: Diagnosis not present

## 2022-01-25 DIAGNOSIS — M542 Cervicalgia: Secondary | ICD-10-CM | POA: Diagnosis not present

## 2022-03-19 DIAGNOSIS — N189 Chronic kidney disease, unspecified: Secondary | ICD-10-CM | POA: Diagnosis not present

## 2022-03-19 DIAGNOSIS — D472 Monoclonal gammopathy: Secondary | ICD-10-CM | POA: Diagnosis not present

## 2022-03-19 DIAGNOSIS — D631 Anemia in chronic kidney disease: Secondary | ICD-10-CM | POA: Diagnosis not present

## 2022-03-19 DIAGNOSIS — N2581 Secondary hyperparathyroidism of renal origin: Secondary | ICD-10-CM | POA: Diagnosis not present

## 2022-03-19 DIAGNOSIS — N1831 Chronic kidney disease, stage 3a: Secondary | ICD-10-CM | POA: Diagnosis not present

## 2022-03-27 ENCOUNTER — Other Ambulatory Visit: Payer: Self-pay | Admitting: Nephrology

## 2022-03-27 DIAGNOSIS — N1831 Chronic kidney disease, stage 3a: Secondary | ICD-10-CM

## 2022-03-30 ENCOUNTER — Ambulatory Visit
Admission: RE | Admit: 2022-03-30 | Discharge: 2022-03-30 | Disposition: A | Discharge status: Home / Self Care | Payer: Medicare Other | Source: Ambulatory Visit | Attending: Nephrology | Admitting: Nephrology

## 2022-03-30 DIAGNOSIS — N1831 Chronic kidney disease, stage 3a: Secondary | ICD-10-CM

## 2022-03-30 DIAGNOSIS — N189 Chronic kidney disease, unspecified: Secondary | ICD-10-CM | POA: Diagnosis not present

## 2022-03-30 DIAGNOSIS — N281 Cyst of kidney, acquired: Secondary | ICD-10-CM | POA: Diagnosis not present

## 2022-04-04 DIAGNOSIS — M5416 Radiculopathy, lumbar region: Secondary | ICD-10-CM | POA: Diagnosis not present

## 2022-04-17 DIAGNOSIS — M5416 Radiculopathy, lumbar region: Secondary | ICD-10-CM | POA: Diagnosis not present

## 2022-05-03 DIAGNOSIS — H6123 Impacted cerumen, bilateral: Secondary | ICD-10-CM | POA: Diagnosis not present

## 2022-06-07 DIAGNOSIS — M1990 Unspecified osteoarthritis, unspecified site: Secondary | ICD-10-CM | POA: Diagnosis not present

## 2022-06-07 DIAGNOSIS — G250 Essential tremor: Secondary | ICD-10-CM | POA: Diagnosis not present

## 2022-06-07 DIAGNOSIS — G7000 Myasthenia gravis without (acute) exacerbation: Secondary | ICD-10-CM | POA: Diagnosis not present

## 2022-06-07 DIAGNOSIS — D508 Other iron deficiency anemias: Secondary | ICD-10-CM | POA: Diagnosis not present

## 2022-06-07 DIAGNOSIS — Z79899 Other long term (current) drug therapy: Secondary | ICD-10-CM | POA: Diagnosis not present

## 2022-06-07 DIAGNOSIS — D649 Anemia, unspecified: Secondary | ICD-10-CM | POA: Diagnosis not present

## 2022-06-07 DIAGNOSIS — Z91041 Radiographic dye allergy status: Secondary | ICD-10-CM | POA: Diagnosis not present

## 2022-06-07 DIAGNOSIS — Z880 Allergy status to penicillin: Secondary | ICD-10-CM | POA: Diagnosis not present

## 2022-06-07 DIAGNOSIS — C61 Malignant neoplasm of prostate: Secondary | ICD-10-CM | POA: Diagnosis not present

## 2022-06-07 DIAGNOSIS — R2689 Other abnormalities of gait and mobility: Secondary | ICD-10-CM | POA: Diagnosis not present

## 2022-06-07 DIAGNOSIS — Z888 Allergy status to other drugs, medicaments and biological substances status: Secondary | ICD-10-CM | POA: Diagnosis not present

## 2022-06-07 DIAGNOSIS — Z7952 Long term (current) use of systemic steroids: Secondary | ICD-10-CM | POA: Diagnosis not present

## 2022-06-07 DIAGNOSIS — Z881 Allergy status to other antibiotic agents status: Secondary | ICD-10-CM | POA: Diagnosis not present

## 2022-06-09 ENCOUNTER — Encounter: Payer: Self-pay | Admitting: Oncology

## 2022-06-18 DIAGNOSIS — D649 Anemia, unspecified: Secondary | ICD-10-CM | POA: Diagnosis not present

## 2022-06-18 DIAGNOSIS — N183 Chronic kidney disease, stage 3 unspecified: Secondary | ICD-10-CM | POA: Diagnosis not present

## 2022-06-18 DIAGNOSIS — Z79899 Other long term (current) drug therapy: Secondary | ICD-10-CM | POA: Diagnosis not present

## 2022-06-18 DIAGNOSIS — R5383 Other fatigue: Secondary | ICD-10-CM | POA: Diagnosis not present

## 2022-06-25 DIAGNOSIS — R82998 Other abnormal findings in urine: Secondary | ICD-10-CM | POA: Diagnosis not present

## 2022-06-25 DIAGNOSIS — K2270 Barrett's esophagus without dysplasia: Secondary | ICD-10-CM | POA: Diagnosis not present

## 2022-06-25 DIAGNOSIS — Z0000 Encounter for general adult medical examination without abnormal findings: Secondary | ICD-10-CM | POA: Diagnosis not present

## 2022-06-25 DIAGNOSIS — I739 Peripheral vascular disease, unspecified: Secondary | ICD-10-CM | POA: Diagnosis not present

## 2022-06-25 DIAGNOSIS — C61 Malignant neoplasm of prostate: Secondary | ICD-10-CM | POA: Diagnosis not present

## 2022-06-26 DIAGNOSIS — Z85828 Personal history of other malignant neoplasm of skin: Secondary | ICD-10-CM | POA: Diagnosis not present

## 2022-06-26 DIAGNOSIS — D2271 Melanocytic nevi of right lower limb, including hip: Secondary | ICD-10-CM | POA: Diagnosis not present

## 2022-06-26 DIAGNOSIS — L821 Other seborrheic keratosis: Secondary | ICD-10-CM | POA: Diagnosis not present

## 2022-06-26 DIAGNOSIS — D2272 Melanocytic nevi of left lower limb, including hip: Secondary | ICD-10-CM | POA: Diagnosis not present

## 2022-07-05 DIAGNOSIS — C61 Malignant neoplasm of prostate: Secondary | ICD-10-CM | POA: Diagnosis not present

## 2022-07-12 DIAGNOSIS — C61 Malignant neoplasm of prostate: Secondary | ICD-10-CM | POA: Diagnosis not present

## 2022-07-13 ENCOUNTER — Inpatient Hospital Stay: Payer: Medicare Other | Attending: Oncology

## 2022-07-13 ENCOUNTER — Other Ambulatory Visit: Payer: Self-pay

## 2022-07-13 ENCOUNTER — Inpatient Hospital Stay: Payer: Medicare Other | Admitting: Oncology

## 2022-07-13 VITALS — BP 138/69 | HR 67 | Temp 97.9°F | Resp 16 | Wt 169.8 lb

## 2022-07-13 DIAGNOSIS — D472 Monoclonal gammopathy: Secondary | ICD-10-CM

## 2022-07-13 DIAGNOSIS — C61 Malignant neoplasm of prostate: Secondary | ICD-10-CM

## 2022-07-13 DIAGNOSIS — Z79899 Other long term (current) drug therapy: Secondary | ICD-10-CM | POA: Insufficient documentation

## 2022-07-13 DIAGNOSIS — Z8546 Personal history of malignant neoplasm of prostate: Secondary | ICD-10-CM | POA: Diagnosis not present

## 2022-07-13 LAB — CBC WITH DIFFERENTIAL (CANCER CENTER ONLY)
Abs Immature Granulocytes: 0.14 10*3/uL — ABNORMAL HIGH (ref 0.00–0.07)
Basophils Absolute: 0.1 10*3/uL (ref 0.0–0.1)
Basophils Relative: 1 %
Eosinophils Absolute: 0 10*3/uL (ref 0.0–0.5)
Eosinophils Relative: 0 %
HCT: 44.2 % (ref 39.0–52.0)
Hemoglobin: 14.5 g/dL (ref 13.0–17.0)
Immature Granulocytes: 1 %
Lymphocytes Relative: 6 %
Lymphs Abs: 0.6 10*3/uL — ABNORMAL LOW (ref 0.7–4.0)
MCH: 30.6 pg (ref 26.0–34.0)
MCHC: 32.8 g/dL (ref 30.0–36.0)
MCV: 93.2 fL (ref 80.0–100.0)
Monocytes Absolute: 0.8 10*3/uL (ref 0.1–1.0)
Monocytes Relative: 7 %
Neutro Abs: 9.7 10*3/uL — ABNORMAL HIGH (ref 1.7–7.7)
Neutrophils Relative %: 85 %
Platelet Count: 227 10*3/uL (ref 150–400)
RBC: 4.74 MIL/uL (ref 4.22–5.81)
RDW: 14 % (ref 11.5–15.5)
WBC Count: 11.3 10*3/uL — ABNORMAL HIGH (ref 4.0–10.5)
nRBC: 0 % (ref 0.0–0.2)

## 2022-07-13 LAB — CMP (CANCER CENTER ONLY)
ALT: 26 U/L (ref 0–44)
AST: 23 U/L (ref 15–41)
Albumin: 3.9 g/dL (ref 3.5–5.0)
Alkaline Phosphatase: 63 U/L (ref 38–126)
Anion gap: 4 — ABNORMAL LOW (ref 5–15)
BUN: 27 mg/dL — ABNORMAL HIGH (ref 8–23)
CO2: 28 mmol/L (ref 22–32)
Calcium: 9.5 mg/dL (ref 8.9–10.3)
Chloride: 107 mmol/L (ref 98–111)
Creatinine: 1.08 mg/dL (ref 0.61–1.24)
GFR, Estimated: >60 mL/min (ref >60)
Glucose, Bld: 150 mg/dL — ABNORMAL HIGH (ref 70–99)
Potassium: 4.3 mmol/L (ref 3.5–5.1)
Sodium: 139 mmol/L (ref 135–145)
Total Bilirubin: 0.7 mg/dL (ref 0.3–1.2)
Total Protein: 6 g/dL — ABNORMAL LOW (ref 6.5–8.1)

## 2022-07-13 NOTE — Progress Notes (Signed)
Hematology and Oncology Follow Up Visit  Reginald Ford [id number] 02-16-1941 81 y.o. 07/13/2022 12:53 PM [name], MDHolwerda, [name], MD   Principle Diagnosis: 81 year old man with IgM lambda monoclonal gammopathy noted in 2014 teen.  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  Reactive findings related to autoimmune etiology and the myasthenia gravis could also be a consideration.     Current therapy: Active surveillance.  Interim History: Mr. Nannini presents today for a follow-up.  Since last visit, he reports no major changes in his health.  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  He denies any bone pain or pathological fractures.  He denies any nausea, vomiting or abdominal pain.  He denies any hospitalizations or illnesses.     Medications: Reviewed without changes. Current Outpatient Medications  Medication Sig Dispense Refill   acetaminophen (TYLENOL) 500 MG tablet Take 1,000 mg by mouth every 6 (six) hours as needed (for pain.).     Alpha-Lipoic Acid 300 MG TABS Take 300 mg by mouth daily.     B Complex Vitamins (B COMPLEX 50 PO) Take 1 tablet by mouth 2 (two) times daily.     CALCIUM CITRATE PO Take 1,000 mg by mouth 2 (two) times daily.     Cholecalciferol (VITAMIN D3) 2000 units TABS Take 2,000 Units by mouth 2 (two) times daily.     Coenzyme Q10 (COQ10) 200 MG CAPS Take 200 mg by mouth daily.     Digestive Enzymes (DIGESTIVE ENZYME PO) Take 1 capsule by mouth 2 (two) times daily.     Magnesium Bisglycinate (MAG GLYCINATE PO) Take 400 mg by mouth 2 (two) times daily.     Multiple Vitamin (MULTIVITAMIN WITH MINERALS) TABS tablet Take 1 tablet by mouth daily.     omeprazole (PRILOSEC) 20 MG capsule Take 20 mg by mouth daily before breakfast.      predniSONE (DELTASONE) 10 MG tablet Take 5-20 mg by mouth See  admin instructions. Typically takes 5 mg by mouth daily but is taking 20 mg by mouth daily for  a week before and after surgery  3   Probiotic Product (PROBIOTIC PO) Take 1 capsule by mouth 2 (two) times daily.     pyridostigmine (MESTINON) 60 MG tablet Take 120 mg by mouth 3 (three) times daily. Morning, Lunch, & supper     Saw Palmetto, Serenoa repens, 320 MG CAPS Take 320 mg by mouth daily.     vitamin C (ASCORBIC ACID) 500 MG tablet Take 500 mg by mouth daily.     No current facility-administered medications for this visit.     Allergies:  Allergies  Allergen Reactions   Aminoglycosides Other (See Comments)    Medication may cause weakness from myasthenia gravis   Beta Adrenergic Blockers Other (See Comments)    Medication may cause weakness from myasthenia gravis   Botulinum Toxins Other (See Comments)    Medication may cause weakness from myasthenia gravis   Calcium Channel Blockers Other (See Comments)    Medication may cause weakness from myasthenia gravis   Curare [Tubocurarine] Other (See Comments)    Medication may cause weakness from myasthenia gravis   Interferons Other (See Comments)    Medication may cause weakness from myasthenia gravis   Iodinated Contrast Media Other (See Comments)    Medication may cause weakness from myasthenia gravis  Lithium Other (See Comments)    Medication may cause Weakness from myasthenia gravis   Macrolides And Ketolides Other (See Comments)    Medication may cause weakness from myastenia gravis   Other Other (See Comments)    Magnesium salts, Fluoroquinolones   Penicillamine Other (See Comments)    Medication may cause weakness from myasthenia gravis   Quinine Derivatives Other (See Comments)    Medication may cause weakness from myastenia gravis   Statins Other (See Comments)    Medication may cause weakness from myasthenia gravis      Physical Exam:  Blood pressure 138/69, pulse 67, temperature 97.9 F (36.6 C),  temperature source Temporal, resp. rate 16, weight 169 lb 12.8 oz (77 kg), SpO2 97 %.   ECOG: 0   General appearance: Comfortable appearing without any discomfort Head: Normocephalic without any trauma Oropharynx: Mucous membranes are moist and pink without any thrush or ulcers. Eyes: Pupils are equal and round reactive to light. Lymph nodes: No cervical, supraclavicular, inguinal or axillary lymphadenopathy.   Heart:regular rate and rhythm.  S1 and S2 without leg edema. Lung: Clear without any rhonchi or wheezes.  No dullness to percussion. Abdomin: Soft, nontender, nondistended with good bowel sounds.  No hepatosplenomegaly. Musculoskeletal: No joint deformity or effusion.  Full range of motion noted. Neurological: No deficits noted on motor, sensory and deep tendon reflex exam. Skin: No petechial rash or dryness.  Appeared moist.      Lab Results: Lab Results  Component Value Date   WBC 7.5 12/15/2021   HGB 14.7 12/15/2021   HCT 44.4 12/15/2021   MCV 92.5 12/15/2021   PLT 221 12/15/2021     Chemistry      Component Value Date/Time   NA 139 12/15/2021 1051   K 4.3 12/15/2021 1051   CL 106 12/15/2021 1051   CO2 30 12/15/2021 1051   BUN 31 (H) 12/15/2021 1051   CREATININE 1.38 (H) 12/15/2021 1051      Component Value Date/Time   CALCIUM 9.3 12/15/2021 1051   ALKPHOS 51 12/15/2021 1051   AST 24 12/15/2021 1051   ALT 23 12/15/2021 1051   BILITOT 1.2 12/15/2021 1051          Impression and Plan:  81 year old man with:   1.  IgM lambda monoclonal gammopathy related to plasma cell disorder versus other lymphoproliferative disorder diagnosed in 2014.  Reactive findings could also be a contributing factor.  Laboratory data in the last 2 years were reviewed and continues to show mild elevation in his IgM and M spike that is less than 1 g/dL without any evidence of endorgan damage.  Differential diagnosis including reactive findings versus lymphoproliferative  disorder among others were reiterated.  At this time, I see no need for any additional workup.  Bone marrow biopsy and imaging studies could be considered if he has symptomatic progression.   2.   T1c, Gleason score 6 prostate cancer diagnosed in 2021.  He is followed by his urologist at [facility].  He has a surveillance MRI scheduled in January.      3.  Follow-up: In 6 months for repeat evaluation and that could be extended to 12 months based on patient's preferences.    30  minutes were dedicated to this encounter.  The time was spent on updating disease status, treatment choices and outlining future plan of care discussion.   Zola Button, MD 12/15/202312:53 PM

## 2022-07-16 DIAGNOSIS — D472 Monoclonal gammopathy: Secondary | ICD-10-CM | POA: Diagnosis not present

## 2022-07-16 DIAGNOSIS — N2581 Secondary hyperparathyroidism of renal origin: Secondary | ICD-10-CM | POA: Diagnosis not present

## 2022-07-16 DIAGNOSIS — N1831 Chronic kidney disease, stage 3a: Secondary | ICD-10-CM | POA: Diagnosis not present

## 2022-07-16 DIAGNOSIS — D631 Anemia in chronic kidney disease: Secondary | ICD-10-CM | POA: Diagnosis not present

## 2022-07-17 LAB — KAPPA/LAMBDA LIGHT CHAINS
Kappa free light chain: 22.7 mg/L — ABNORMAL HIGH (ref 3.3–19.4)
Kappa, lambda light chain ratio: 1.6 (ref 0.26–1.65)
Lambda free light chains: 14.2 mg/L (ref 5.7–26.3)

## 2022-07-19 LAB — MULTIPLE MYELOMA PANEL, SERUM
Albumin SerPl Elph-Mcnc: 3.7 g/dL (ref 2.9–4.4)
Albumin/Glob SerPl: 1.7 (ref 0.7–1.7)
Alpha 1: 0.2 g/dL (ref 0.0–0.4)
Alpha2 Glob SerPl Elph-Mcnc: 0.7 g/dL (ref 0.4–1.0)
B-Globulin SerPl Elph-Mcnc: 0.7 g/dL (ref 0.7–1.3)
Gamma Glob SerPl Elph-Mcnc: 0.7 g/dL (ref 0.4–1.8)
Globulin, Total: 2.2 g/dL (ref 2.2–3.9)
IgA: 180 mg/dL (ref 61–437)
IgG (Immunoglobin G), Serum: 566 mg/dL — ABNORMAL LOW (ref 603–1613)
IgM (Immunoglobulin M), Srm: 227 mg/dL — ABNORMAL HIGH (ref 15–143)
M Protein SerPl Elph-Mcnc: 0.3 g/dL — ABNORMAL HIGH
Total Protein ELP: 5.9 g/dL — ABNORMAL LOW (ref 6.0–8.5)

## 2022-08-07 DIAGNOSIS — H5203 Hypermetropia, bilateral: Secondary | ICD-10-CM | POA: Diagnosis not present

## 2022-08-08 DIAGNOSIS — C61 Malignant neoplasm of prostate: Secondary | ICD-10-CM | POA: Diagnosis not present

## 2022-08-09 DIAGNOSIS — C61 Malignant neoplasm of prostate: Secondary | ICD-10-CM | POA: Diagnosis not present

## 2022-08-09 DIAGNOSIS — Z8546 Personal history of malignant neoplasm of prostate: Secondary | ICD-10-CM | POA: Diagnosis not present

## 2022-08-14 DIAGNOSIS — K080 Exfoliation of teeth due to systemic causes: Secondary | ICD-10-CM | POA: Diagnosis not present

## 2022-08-15 DIAGNOSIS — G7000 Myasthenia gravis without (acute) exacerbation: Secondary | ICD-10-CM | POA: Diagnosis not present

## 2022-08-15 DIAGNOSIS — R197 Diarrhea, unspecified: Secondary | ICD-10-CM | POA: Diagnosis not present

## 2022-08-24 ENCOUNTER — Telehealth: Payer: Self-pay | Admitting: Hematology

## 2022-08-24 NOTE — Telephone Encounter (Signed)
Called patient to r/s appointment time to new patient slot per 1/25 in basket. Left voicemail with new appointment time.

## 2022-09-03 DIAGNOSIS — R197 Diarrhea, unspecified: Secondary | ICD-10-CM | POA: Diagnosis not present

## 2022-09-04 DIAGNOSIS — R197 Diarrhea, unspecified: Secondary | ICD-10-CM | POA: Diagnosis not present

## 2022-09-20 DIAGNOSIS — Z881 Allergy status to other antibiotic agents status: Secondary | ICD-10-CM | POA: Diagnosis not present

## 2022-09-20 DIAGNOSIS — Z7952 Long term (current) use of systemic steroids: Secondary | ICD-10-CM | POA: Diagnosis not present

## 2022-09-20 DIAGNOSIS — G7000 Myasthenia gravis without (acute) exacerbation: Secondary | ICD-10-CM | POA: Diagnosis not present

## 2022-09-20 DIAGNOSIS — Z888 Allergy status to other drugs, medicaments and biological substances status: Secondary | ICD-10-CM | POA: Diagnosis not present

## 2022-09-25 DIAGNOSIS — J342 Deviated nasal septum: Secondary | ICD-10-CM | POA: Diagnosis not present

## 2022-09-25 DIAGNOSIS — G7000 Myasthenia gravis without (acute) exacerbation: Secondary | ICD-10-CM | POA: Diagnosis not present

## 2022-09-25 DIAGNOSIS — R431 Parosmia: Secondary | ICD-10-CM | POA: Diagnosis not present

## 2022-10-04 DIAGNOSIS — R197 Diarrhea, unspecified: Secondary | ICD-10-CM | POA: Diagnosis not present

## 2022-10-04 DIAGNOSIS — K2270 Barrett's esophagus without dysplasia: Secondary | ICD-10-CM | POA: Diagnosis not present

## 2022-11-06 DIAGNOSIS — M19042 Primary osteoarthritis, left hand: Secondary | ICD-10-CM | POA: Diagnosis not present

## 2022-11-06 DIAGNOSIS — M19041 Primary osteoarthritis, right hand: Secondary | ICD-10-CM | POA: Diagnosis not present

## 2022-11-26 DIAGNOSIS — Z961 Presence of intraocular lens: Secondary | ICD-10-CM | POA: Diagnosis not present

## 2022-11-26 DIAGNOSIS — H04123 Dry eye syndrome of bilateral lacrimal glands: Secondary | ICD-10-CM | POA: Diagnosis not present

## 2022-12-03 DIAGNOSIS — G7000 Myasthenia gravis without (acute) exacerbation: Secondary | ICD-10-CM | POA: Diagnosis not present

## 2022-12-03 DIAGNOSIS — D84821 Immunodeficiency due to drugs: Secondary | ICD-10-CM | POA: Diagnosis not present

## 2022-12-03 DIAGNOSIS — I739 Peripheral vascular disease, unspecified: Secondary | ICD-10-CM | POA: Diagnosis not present

## 2022-12-03 DIAGNOSIS — C61 Malignant neoplasm of prostate: Secondary | ICD-10-CM | POA: Diagnosis not present

## 2022-12-03 DIAGNOSIS — D692 Other nonthrombocytopenic purpura: Secondary | ICD-10-CM | POA: Diagnosis not present

## 2022-12-13 DIAGNOSIS — M5416 Radiculopathy, lumbar region: Secondary | ICD-10-CM | POA: Diagnosis not present

## 2022-12-18 ENCOUNTER — Other Ambulatory Visit: Payer: Medicare Other

## 2022-12-25 ENCOUNTER — Ambulatory Visit: Payer: Medicare Other | Admitting: Oncology

## 2022-12-31 DIAGNOSIS — M5416 Radiculopathy, lumbar region: Secondary | ICD-10-CM | POA: Diagnosis not present

## 2022-12-31 DIAGNOSIS — M47816 Spondylosis without myelopathy or radiculopathy, lumbar region: Secondary | ICD-10-CM | POA: Diagnosis not present

## 2023-01-04 DIAGNOSIS — Z980 Intestinal bypass and anastomosis status: Secondary | ICD-10-CM | POA: Diagnosis not present

## 2023-01-04 DIAGNOSIS — R197 Diarrhea, unspecified: Secondary | ICD-10-CM | POA: Diagnosis not present

## 2023-01-04 DIAGNOSIS — K2270 Barrett's esophagus without dysplasia: Secondary | ICD-10-CM | POA: Diagnosis not present

## 2023-01-04 DIAGNOSIS — Z86010 Personal history of colonic polyps: Secondary | ICD-10-CM | POA: Diagnosis not present

## 2023-01-04 DIAGNOSIS — K5730 Diverticulosis of large intestine without perforation or abscess without bleeding: Secondary | ICD-10-CM | POA: Diagnosis not present

## 2023-01-04 DIAGNOSIS — Z8719 Personal history of other diseases of the digestive system: Secondary | ICD-10-CM | POA: Diagnosis not present

## 2023-01-04 DIAGNOSIS — K449 Diaphragmatic hernia without obstruction or gangrene: Secondary | ICD-10-CM | POA: Diagnosis not present

## 2023-01-08 DIAGNOSIS — M47816 Spondylosis without myelopathy or radiculopathy, lumbar region: Secondary | ICD-10-CM | POA: Diagnosis not present

## 2023-01-10 ENCOUNTER — Other Ambulatory Visit: Payer: Self-pay

## 2023-01-10 DIAGNOSIS — D472 Monoclonal gammopathy: Secondary | ICD-10-CM

## 2023-01-11 ENCOUNTER — Other Ambulatory Visit: Payer: Self-pay

## 2023-01-11 ENCOUNTER — Inpatient Hospital Stay: Payer: Medicare Other

## 2023-01-11 ENCOUNTER — Inpatient Hospital Stay: Payer: Medicare Other | Admitting: Hematology

## 2023-01-11 ENCOUNTER — Other Ambulatory Visit: Payer: Medicare Other

## 2023-01-11 ENCOUNTER — Ambulatory Visit: Payer: Medicare Other | Admitting: Hematology

## 2023-01-11 ENCOUNTER — Inpatient Hospital Stay: Payer: Medicare Other | Attending: Hematology

## 2023-01-11 VITALS — BP 144/72 | HR 59 | Temp 98.1°F | Resp 18 | Wt 169.5 lb

## 2023-01-11 DIAGNOSIS — R600 Localized edema: Secondary | ICD-10-CM | POA: Diagnosis not present

## 2023-01-11 DIAGNOSIS — R5383 Other fatigue: Secondary | ICD-10-CM

## 2023-01-11 DIAGNOSIS — Z8049 Family history of malignant neoplasm of other genital organs: Secondary | ICD-10-CM | POA: Insufficient documentation

## 2023-01-11 DIAGNOSIS — M791 Myalgia, unspecified site: Secondary | ICD-10-CM | POA: Insufficient documentation

## 2023-01-11 DIAGNOSIS — Z800 Family history of malignant neoplasm of digestive organs: Secondary | ICD-10-CM | POA: Insufficient documentation

## 2023-01-11 DIAGNOSIS — Z8041 Family history of malignant neoplasm of ovary: Secondary | ICD-10-CM | POA: Insufficient documentation

## 2023-01-11 DIAGNOSIS — G7000 Myasthenia gravis without (acute) exacerbation: Secondary | ICD-10-CM | POA: Insufficient documentation

## 2023-01-11 DIAGNOSIS — Z8546 Personal history of malignant neoplasm of prostate: Secondary | ICD-10-CM | POA: Diagnosis not present

## 2023-01-11 DIAGNOSIS — D472 Monoclonal gammopathy: Secondary | ICD-10-CM

## 2023-01-11 DIAGNOSIS — Z801 Family history of malignant neoplasm of trachea, bronchus and lung: Secondary | ICD-10-CM | POA: Diagnosis not present

## 2023-01-11 DIAGNOSIS — Z79899 Other long term (current) drug therapy: Secondary | ICD-10-CM | POA: Insufficient documentation

## 2023-01-11 DIAGNOSIS — D649 Anemia, unspecified: Secondary | ICD-10-CM | POA: Insufficient documentation

## 2023-01-11 DIAGNOSIS — E610 Copper deficiency: Secondary | ICD-10-CM | POA: Insufficient documentation

## 2023-01-11 DIAGNOSIS — E860 Dehydration: Secondary | ICD-10-CM | POA: Insufficient documentation

## 2023-01-11 DIAGNOSIS — Z8042 Family history of malignant neoplasm of prostate: Secondary | ICD-10-CM | POA: Diagnosis not present

## 2023-01-11 LAB — CBC WITH DIFFERENTIAL (CANCER CENTER ONLY)
Abs Immature Granulocytes: 0.08 10*3/uL — ABNORMAL HIGH (ref 0.00–0.07)
Basophils Absolute: 0.1 10*3/uL (ref 0.0–0.1)
Basophils Relative: 2 %
Eosinophils Absolute: 0.2 10*3/uL (ref 0.0–0.5)
Eosinophils Relative: 3 %
HCT: 43 % (ref 39.0–52.0)
Hemoglobin: 14.3 g/dL (ref 13.0–17.0)
Immature Granulocytes: 1 %
Lymphocytes Relative: 14 %
Lymphs Abs: 1.3 10*3/uL (ref 0.7–4.0)
MCH: 30.9 pg (ref 26.0–34.0)
MCHC: 33.3 g/dL (ref 30.0–36.0)
MCV: 92.9 fL (ref 80.0–100.0)
Monocytes Absolute: 1 10*3/uL (ref 0.1–1.0)
Monocytes Relative: 12 %
Neutro Abs: 6.1 10*3/uL (ref 1.7–7.7)
Neutrophils Relative %: 68 %
Platelet Count: 231 10*3/uL (ref 150–400)
RBC: 4.63 MIL/uL (ref 4.22–5.81)
RDW: 13.4 % (ref 11.5–15.5)
WBC Count: 8.8 10*3/uL (ref 4.0–10.5)
nRBC: 0 % (ref 0.0–0.2)

## 2023-01-11 LAB — CMP (CANCER CENTER ONLY)
ALT: 24 U/L (ref 0–44)
AST: 19 U/L (ref 15–41)
Albumin: 3.8 g/dL (ref 3.5–5.0)
Alkaline Phosphatase: 56 U/L (ref 38–126)
Anion gap: 6 (ref 5–15)
BUN: 24 mg/dL — ABNORMAL HIGH (ref 8–23)
CO2: 24 mmol/L (ref 22–32)
Calcium: 9.2 mg/dL (ref 8.9–10.3)
Chloride: 109 mmol/L (ref 98–111)
Creatinine: 1.04 mg/dL (ref 0.61–1.24)
GFR, Estimated: >60 mL/min (ref >60)
Glucose, Bld: 98 mg/dL (ref 70–99)
Potassium: 3.8 mmol/L (ref 3.5–5.1)
Sodium: 139 mmol/L (ref 135–145)
Total Bilirubin: 0.8 mg/dL (ref 0.3–1.2)
Total Protein: 6 g/dL — ABNORMAL LOW (ref 6.5–8.1)

## 2023-01-11 LAB — VITAMIN B12: Vitamin B-12: 527 pg/mL (ref 180–914)

## 2023-01-11 LAB — VITAMIN D 25 HYDROXY (VIT D DEFICIENCY, FRACTURES): Vit D, 25-Hydroxy: 46.11 ng/mL (ref 30–100)

## 2023-01-11 LAB — IRON AND IRON BINDING CAPACITY (CC-WL,HP ONLY)
Iron: 119 ug/dL (ref 45–182)
Saturation Ratios: 36 % (ref 17.9–39.5)
TIBC: 329 ug/dL (ref 250–450)
UIBC: 210 ug/dL (ref 117–376)

## 2023-01-11 LAB — FERRITIN: Ferritin: 35 ng/mL (ref 24–336)

## 2023-01-11 LAB — TSH: TSH: 1.305 u[IU]/mL (ref 0.350–4.500)

## 2023-01-11 NOTE — Progress Notes (Signed)
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   HISTORY OF PRESENTING ILLNESS:  Reginald Ford is a wonderful 82 y.o. male who has been a previous patient of Dr. Clelia Croft. 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   Today, patient notes he is doing well overall since his last visit with Dr. Clelia Croft. He notes has been monitoring his M protein since 2012 when he lived in [place]. He has since been visiting a rheumatologist annually.   Patient complains of worsened fatigue over the past year. He notes his walking gait has slowed down. He denies any changes in strength or mobility. He denies chills.   He reports chronic bilateral LE edema, which he treats by elevating his LE at night and uses compression socks.   Patient notes his prostate cancer is stable. He continues to follow with his urologist and is currently in the observation stage. He denies having had radiation or surgery for prostate cancer. He has had multiple MRI's of the prostate that have shown no abnormalities.   He has been on Prilosec 20 mg since 2009 or 2010 and was previously told that it may have explained his history of iron deficiency. Patient denies having any vitamin deficiencies or taking any hormone blocking treatments.  Patient continues to take low dose Prednisone and Pyridostigmine 3x a day. He has been told that his weakness is not related to myasthenia. He has not been on any other immuno suppressant and denies previously receiving IVIG.    He also complains of bilateral muscle pain when lifting his arms to shoulder level. He denies any difficulty standing.   He reports  receiving an iron infusion 8 years ago but denies any recent iron infusions. He notes that previous iron infusions did improve fatigue. He notes he has previous lower back pain.   He is going to a physical therapist on Monday, 01/14/2023, for issues with his L3 vertebrae. He had an epidural a month ago due to the nerves between his L3 vertebrae causing issues, which helped improve issues. His cervical spine issue does not limit his activity.   Patient notes his balance has been gradually decreasing over the past 10 years. He denies any issues with falls or being unstable when his eyes are closed. He notes his neurologist at [facility] does not think his balance issues are related to neurological issues. Patient reports he has been doing yoga for the past 20 years.  MEDICAL HISTORY:  Past Medical History:  Diagnosis Date   Anemia    hx of iron infusions - 01/2014 and 01/2015   Arthritis    shoulder   Barrett's esophagus    Cancer (HCC)    basal cell cancer   Cramps, muscle, general    if he gets dehyrated   Diverticulitis    Family history of cancer of gallbladder    Family history of cancer of tongue    Family history of cervical cancer    Family history of esophageal cancer    Family history of lung cancer    Family history of ovarian cancer    Family history of prostate cancer    Genetic testing 11/25/2019   Negative genetic  testing:  No pathogenic variants detected on the [employer] Multi-Cancer Panel. The report date is 11/23/2019.   History of hiatal hernia    Kidney function test abnormal    ELEVATED LEVELS  WATCHED BY PCP   Myasthenia gravis (HCC)    "ocular" myasthenia gravis   Pneumonia    as a child. FALL 2018      SURGICAL HISTORY: Past Surgical History:  Procedure Laterality Date   ANTERIOR CERVICAL DECOMP/DISCECTOMY FUSION N/A 09/05/2017   Procedure: ANTERIOR CERVICAL DECOMPRESSION FUSION CERVICAL FOUR-FIVE,CERVICAL FIVE-SIX.;  Surgeon: Tia Alert, MD;  Location: [facility]  OR;  Service: Neurosurgery;  Laterality: N/A;  anterior   CATARACT EXTRACTION, BILATERAL     COLON SURGERY     COLONOSCOPY     COLOSTOMY  02/2012   for Diverticulitis   COLOSTOMY REVERSAL  05/2012   LUMBAR LAMINECTOMY/DECOMPRESSION MICRODISCECTOMY Bilateral 08/18/2015   Procedure: Laminectomy and Foraminotomy - L4-L5 - L5-S1 - bilateral;  Surgeon: Tia Alert, MD;  Location: [facility];  Service: Neurosurgery;  Laterality: Bilateral;   VASECTOMY      SOCIAL HISTORY: Social History   Socioeconomic History   Marital status: Married    Spouse name: Not on file   Number of children: Not on file   Years of education: Not on file   Highest education level: Not on file  Occupational History   Occupation: retired    Comment: Acupuncturist  Tobacco Use   Smoking status: Never   Smokeless tobacco: Never  Vaping Use   Vaping Use: Never used  Substance and Sexual Activity   Alcohol use: No   Drug use: No   Sexual activity: Not on file  Other Topics Concern   Not on file  Social History Narrative   Not on file   Social Determinants of Health   Financial Resource Strain: Not on file  Food Insecurity: Not on file  Transportation Needs: Not on file  Physical Activity: Not on file  Stress: Not on file  Social Connections: Not on file  Intimate Partner Violence: Not on file    FAMILY HISTORY: Family History  Problem Relation Age of Onset   Hypertension Mother    Hypotension Father    Prostate cancer Father 56       metastatic   Cancer Sister 54       gallbladder   Lung cancer Brother 71       metastatic to brain   Stroke Brother    Cervical cancer Daughter 34   Tongue cancer Maternal Uncle        dx. in his 85s   Cancer Other 27       gallbladder; maternal great-aunt   Ovarian cancer Niece 60   Esophageal cancer Maternal Great-grandfather    Barrett's esophagus Maternal Great-grandfather     ALLERGIES:  is allergic to aminoglycosides, beta adrenergic  blockers, botulinum toxins, calcium channel blockers, curare [tubocurarine], interferons, iodinated contrast media, lithium, macrolides and ketolides, other, penicillamine, quinine derivatives, and statins.  MEDICATIONS:  Current Outpatient Medications  Medication Sig Dispense Refill   acetaminophen (TYLENOL) 500 MG tablet Take 1,000 mg by mouth every 6 (six) hours as needed (for pain.).     Alpha-Lipoic Acid 300 MG TABS Take 300 mg by mouth daily.     B Complex Vitamins (B COMPLEX 50 PO) Take 1 tablet by mouth 2 (two) times daily.     CALCIUM CITRATE PO Take 1,000 mg by mouth 2 (two) times daily.  Cholecalciferol (VITAMIN D3) 2000 units TABS Take 2,000 Units by mouth 2 (two) times daily.     Coenzyme Q10 (COQ10) 200 MG CAPS Take 200 mg by mouth daily.     Digestive Enzymes (DIGESTIVE ENZYME PO) Take 1 capsule by mouth 2 (two) times daily.     Magnesium Bisglycinate (MAG GLYCINATE PO) Take 400 mg by mouth 2 (two) times daily.     Multiple Vitamin (MULTIVITAMIN WITH MINERALS) TABS tablet Take 1 tablet by mouth daily.     omeprazole (PRILOSEC) 20 MG capsule Take 20 mg by mouth daily before breakfast.      predniSONE (DELTASONE) 10 MG tablet Take 5-20 mg by mouth See admin instructions. Typically takes 5 mg by mouth daily but is taking 20 mg by mouth daily for  a week before and after surgery  3   Probiotic Product (PROBIOTIC PO) Take 1 capsule by mouth 2 (two) times daily.     pyridostigmine (MESTINON) 60 MG tablet Take 120 mg by mouth 3 (three) times daily. Morning, Lunch, & supper     Saw Palmetto, Serenoa repens, 320 MG CAPS Take 320 mg by mouth daily.     vitamin C (ASCORBIC ACID) 500 MG tablet Take 500 mg by mouth daily.     No current facility-administered medications for this visit.    REVIEW OF SYSTEMS:    10 Point review of Systems was done is negative except as noted above.  PHYSICAL EXAMINATION: ECOG PERFORMANCE STATUS: 1 - Symptomatic but completely ambulatory  . Vitals:    01/11/23 1053  BP: (!) 144/72  Pulse: (!) 59  Resp: 18  Temp: 98.1 F (36.7 C)  SpO2: 97%   Filed Weights   01/11/23 1053  Weight: 169 lb 8 oz (76.9 kg)   .Body mass index is 26.55 kg/m.  GENERAL:alert, in no acute distress and comfortable SKIN: no acute rashes, no significant lesions EYES: conjunctiva are pink and non-injected, sclera anicteric OROPHARYNX: MMM, no exudates, no oropharyngeal erythema or ulceration NECK: supple, no JVD LYMPH:  no palpable lymphadenopathy in the cervical, axillary or inguinal regions LUNGS: clear to auscultation b/l with normal respiratory effort HEART: regular rate & rhythm ABDOMEN:  normoactive bowel sounds , non tender, not distended. Extremity: no pedal edema PSYCH: alert & oriented x 3 with fluent speech NEURO: no focal motor/sensory deficits  LABORATORY DATA:  I have reviewed the data as listed  .    Latest Ref Rng & Units 01/11/2023   10:34 AM 07/13/2022   12:57 PM 12/15/2021   10:51 AM  CBC  WBC 4.0 - 10.5 K/uL 8.8  11.3  7.5   Hemoglobin 13.0 - 17.0 g/dL 16.1  09.6  04.5   Hematocrit 39.0 - 52.0 % 43.0  44.2  44.4   Platelets 150 - 400 K/uL 231  227  221     .    Latest Ref Rng & Units 01/11/2023   10:34 AM 07/13/2022   12:57 PM 12/15/2021   10:51 AM  CMP  Glucose 70 - 99 mg/dL 98  409  87   BUN 8 - 23 mg/dL 24  27  31    Creatinine 0.61 - 1.24 mg/dL 8.11  9.14  7.82   Sodium 135 - 145 mmol/L 139  139  139   Potassium 3.5 - 5.1 mmol/L 3.8  4.3  4.3   Chloride 98 - 111 mmol/L 109  107  106   CO2 22 - 32 mmol/L 24  28  30  Calcium 8.9 - 10.3 mg/dL 9.2  9.5  9.3   Total Protein 6.5 - 8.1 g/dL 6.0  6.0  6.4   Total Bilirubin 0.3 - 1.2 mg/dL 0.8  0.7  1.2   Alkaline Phos 38 - 126 U/L 56  63  51   AST 15 - 41 U/L 19  23  24    ALT 0 - 44 U/L 24  26  23       RADIOGRAPHIC STUDIES: I have personally reviewed the radiological images as listed and agreed with the findings in the report. No results found.  ASSESSMENT &  PLAN:   82 y.o. 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  Differential diagnosis including reactive findings versus lymphoproliferative disorder among others were reiterated.  Bone marrow biopsy and imaging studies could be considered if he has symptomatic progression.   2.   T1c, Gleason score 6 prostate cancer diagnosed in 2021.  He is followed by his urologist at [facility].  He has a surveillance MRI scheduled in January.  PLAN:  -Discussed lab results on 01/11/2023 in detail with patient. CBC normal, showed WBC of 8.8K, hemoglobin of 14.3, and platelets of 231K. -CMP shows stable kidney function, mild dehydration -informed patient that M protein may be involved in a reactive process to chronic inflammation -Informed patient his IgM protein is not usually associated with plasma cell disorders and is unlikely to progress to multiple myeloma -informed patient that there is a possibility for igM lambda to progress to LPL or Waldenstrom and may present with increased cells in the bone marrow -Informs patient that vitamin deficiencies, certain medications, and back issues can cause fatigue -Informed patient that IgM protein causing fatigue is unlikely -Informed patient that Vitamin B-12  and copper deficiency can cause balance issues -Discussed that patient's amount of Igm protein is low  -Educated patient that IgM may get misdirected to different structures in the body  -Advised patient to follow-up with PCP in regards to his fatigue -Informed patient if his IgM protein becomes elevated by greater than about 1 g, or if he endorses any associated symptoms such as worsened fatigue, night sweats, fever, unexpected weight loss, or has enlarged lymph nodes, further workup  including a CT scan would be needed to evaluate for any signs of lymphoma -Discussed the option of doing labs for iron and protein electrophoresis today or at a later date -Patient will proceed with iron and electrophoresis labs today after visit -will order blood tests to rule out any vitamin deficiencies -Advised patient to stay well hydrated -answered all of patient's questions in detail -continue to follow regularly with neurologist, urologist, and PCP for optimal care  FOLLOW-UP: RTC with Dr Candise Che in 6 months Labs 1 week prior to clinic visit  The total time spent in the appointment was 30 minutes* .  All of the patient's questions were answered with apparent satisfaction. The patient knows to call the clinic with any problems, questions or concerns.   Wyvonnia Lora MD MS AAHIVMS [facility] Hematology/Oncology Physician [facility]  .*Total Encounter Time as defined by the Centers for Medicare and Medicaid Services includes, in addition to the face-to-face time of a patient visit (documented in the note above) non-face-to-face time: obtaining and reviewing outside  history, ordering and reviewing medications, tests or procedures, care coordination (communications with other health care professionals or caregivers) and documentation in the medical record.    Alben Deeds Teague,acting as a Neurosurgeon for Wyvonnia Lora, MD.,have documented all relevant documentation on the behalf of Wyvonnia Lora, MD,as directed by  Wyvonnia Lora, MD while in the presence of Wyvonnia Lora, MD.  .I have reviewed the above documentation for accuracy and completeness, and I agree with the above. Johney Maine MD

## 2023-01-14 DIAGNOSIS — M48061 Spinal stenosis, lumbar region without neurogenic claudication: Secondary | ICD-10-CM | POA: Diagnosis not present

## 2023-01-14 DIAGNOSIS — M25519 Pain in unspecified shoulder: Secondary | ICD-10-CM | POA: Diagnosis not present

## 2023-01-14 LAB — KAPPA/LAMBDA LIGHT CHAINS
Kappa free light chain: 19.8 mg/L — ABNORMAL HIGH (ref 3.3–19.4)
Kappa, lambda light chain ratio: 1.64 (ref 0.26–1.65)
Lambda free light chains: 12.1 mg/L (ref 5.7–26.3)

## 2023-01-14 LAB — COPPER, SERUM: Copper: 63 ug/dL — ABNORMAL LOW (ref 69–132)

## 2023-01-16 LAB — MULTIPLE MYELOMA PANEL, SERUM
Albumin SerPl Elph-Mcnc: 3.6 g/dL (ref 2.9–4.4)
Albumin/Glob SerPl: 1.6 (ref 0.7–1.7)
Alpha 1: 0.2 g/dL (ref 0.0–0.4)
Alpha2 Glob SerPl Elph-Mcnc: 0.7 g/dL (ref 0.4–1.0)
B-Globulin SerPl Elph-Mcnc: 0.7 g/dL (ref 0.7–1.3)
Gamma Glob SerPl Elph-Mcnc: 0.7 g/dL (ref 0.4–1.8)
Globulin, Total: 2.3 g/dL (ref 2.2–3.9)
IgA: 169 mg/dL (ref 61–437)
IgG (Immunoglobin G), Serum: 620 mg/dL (ref 603–1613)
IgM (Immunoglobulin M), Srm: 243 mg/dL — ABNORMAL HIGH (ref 15–143)
M Protein SerPl Elph-Mcnc: 0.3 g/dL — ABNORMAL HIGH
Total Protein ELP: 5.9 g/dL — ABNORMAL LOW (ref 6.0–8.5)

## 2023-01-16 LAB — FOLATE RBC
Folate, Hemolysate: >620 ng/mL
Folate, RBC: >1384 ng/mL (ref >498)
Hematocrit: 44.8 % (ref 37.5–51.0)

## 2023-01-17 DIAGNOSIS — M25519 Pain in unspecified shoulder: Secondary | ICD-10-CM | POA: Diagnosis not present

## 2023-01-17 DIAGNOSIS — M48061 Spinal stenosis, lumbar region without neurogenic claudication: Secondary | ICD-10-CM | POA: Diagnosis not present

## 2023-01-21 ENCOUNTER — Telehealth: Payer: Self-pay | Admitting: Hematology

## 2023-01-21 DIAGNOSIS — M47816 Spondylosis without myelopathy or radiculopathy, lumbar region: Secondary | ICD-10-CM | POA: Diagnosis not present

## 2023-01-23 DIAGNOSIS — M48061 Spinal stenosis, lumbar region without neurogenic claudication: Secondary | ICD-10-CM | POA: Diagnosis not present

## 2023-01-25 DIAGNOSIS — M48061 Spinal stenosis, lumbar region without neurogenic claudication: Secondary | ICD-10-CM | POA: Diagnosis not present

## 2023-01-28 DIAGNOSIS — M48061 Spinal stenosis, lumbar region without neurogenic claudication: Secondary | ICD-10-CM | POA: Diagnosis not present

## 2023-01-28 DIAGNOSIS — M25519 Pain in unspecified shoulder: Secondary | ICD-10-CM | POA: Diagnosis not present

## 2023-01-29 DIAGNOSIS — Z8719 Personal history of other diseases of the digestive system: Secondary | ICD-10-CM | POA: Diagnosis not present

## 2023-01-29 DIAGNOSIS — G7000 Myasthenia gravis without (acute) exacerbation: Secondary | ICD-10-CM | POA: Diagnosis not present

## 2023-01-29 DIAGNOSIS — Z7189 Other specified counseling: Secondary | ICD-10-CM | POA: Diagnosis not present

## 2023-01-29 DIAGNOSIS — M159 Polyosteoarthritis, unspecified: Secondary | ICD-10-CM | POA: Diagnosis not present

## 2023-01-29 DIAGNOSIS — K2270 Barrett's esophagus without dysplasia: Secondary | ICD-10-CM | POA: Diagnosis not present

## 2023-01-29 DIAGNOSIS — G250 Essential tremor: Secondary | ICD-10-CM | POA: Diagnosis not present

## 2023-01-29 DIAGNOSIS — R413 Other amnesia: Secondary | ICD-10-CM | POA: Diagnosis not present

## 2023-01-29 DIAGNOSIS — Z8546 Personal history of malignant neoplasm of prostate: Secondary | ICD-10-CM | POA: Diagnosis not present

## 2023-01-29 DIAGNOSIS — F5101 Primary insomnia: Secondary | ICD-10-CM | POA: Diagnosis not present

## 2023-01-29 DIAGNOSIS — D472 Monoclonal gammopathy: Secondary | ICD-10-CM | POA: Diagnosis not present

## 2023-01-30 DIAGNOSIS — M48061 Spinal stenosis, lumbar region without neurogenic claudication: Secondary | ICD-10-CM | POA: Diagnosis not present

## 2023-01-30 DIAGNOSIS — M25519 Pain in unspecified shoulder: Secondary | ICD-10-CM | POA: Diagnosis not present

## 2023-02-04 DIAGNOSIS — M48061 Spinal stenosis, lumbar region without neurogenic claudication: Secondary | ICD-10-CM | POA: Diagnosis not present

## 2023-02-04 DIAGNOSIS — M25519 Pain in unspecified shoulder: Secondary | ICD-10-CM | POA: Diagnosis not present

## 2023-02-06 DIAGNOSIS — M48061 Spinal stenosis, lumbar region without neurogenic claudication: Secondary | ICD-10-CM | POA: Diagnosis not present

## 2023-02-06 DIAGNOSIS — M25519 Pain in unspecified shoulder: Secondary | ICD-10-CM | POA: Diagnosis not present

## 2023-02-07 DIAGNOSIS — M792 Neuralgia and neuritis, unspecified: Secondary | ICD-10-CM | POA: Diagnosis not present

## 2023-02-07 DIAGNOSIS — I70203 Unspecified atherosclerosis of native arteries of extremities, bilateral legs: Secondary | ICD-10-CM | POA: Diagnosis not present

## 2023-02-07 DIAGNOSIS — M19071 Primary osteoarthritis, right ankle and foot: Secondary | ICD-10-CM | POA: Diagnosis not present

## 2023-02-07 DIAGNOSIS — M19072 Primary osteoarthritis, left ankle and foot: Secondary | ICD-10-CM | POA: Diagnosis not present

## 2023-02-11 DIAGNOSIS — M25519 Pain in unspecified shoulder: Secondary | ICD-10-CM | POA: Diagnosis not present

## 2023-02-11 DIAGNOSIS — M48061 Spinal stenosis, lumbar region without neurogenic claudication: Secondary | ICD-10-CM | POA: Diagnosis not present

## 2023-02-12 DIAGNOSIS — F5101 Primary insomnia: Secondary | ICD-10-CM | POA: Diagnosis not present

## 2023-02-12 DIAGNOSIS — F419 Anxiety disorder, unspecified: Secondary | ICD-10-CM | POA: Diagnosis not present

## 2023-02-12 DIAGNOSIS — R413 Other amnesia: Secondary | ICD-10-CM | POA: Diagnosis not present

## 2023-02-12 DIAGNOSIS — F341 Dysthymic disorder: Secondary | ICD-10-CM | POA: Diagnosis not present

## 2023-02-13 DIAGNOSIS — M25519 Pain in unspecified shoulder: Secondary | ICD-10-CM | POA: Diagnosis not present

## 2023-02-13 DIAGNOSIS — M48061 Spinal stenosis, lumbar region without neurogenic claudication: Secondary | ICD-10-CM | POA: Diagnosis not present

## 2023-02-14 DIAGNOSIS — D472 Monoclonal gammopathy: Secondary | ICD-10-CM | POA: Diagnosis not present

## 2023-02-14 DIAGNOSIS — C61 Malignant neoplasm of prostate: Secondary | ICD-10-CM | POA: Diagnosis not present

## 2023-02-14 DIAGNOSIS — G7000 Myasthenia gravis without (acute) exacerbation: Secondary | ICD-10-CM | POA: Diagnosis not present

## 2023-02-20 DIAGNOSIS — M25511 Pain in right shoulder: Secondary | ICD-10-CM | POA: Diagnosis not present

## 2023-02-20 DIAGNOSIS — M25512 Pain in left shoulder: Secondary | ICD-10-CM | POA: Diagnosis not present

## 2023-02-21 DIAGNOSIS — L821 Other seborrheic keratosis: Secondary | ICD-10-CM | POA: Diagnosis not present

## 2023-02-21 DIAGNOSIS — D692 Other nonthrombocytopenic purpura: Secondary | ICD-10-CM | POA: Diagnosis not present

## 2023-02-21 DIAGNOSIS — L720 Epidermal cyst: Secondary | ICD-10-CM | POA: Diagnosis not present

## 2023-02-21 DIAGNOSIS — Z85828 Personal history of other malignant neoplasm of skin: Secondary | ICD-10-CM | POA: Diagnosis not present

## 2023-02-23 DIAGNOSIS — M25511 Pain in right shoulder: Secondary | ICD-10-CM | POA: Diagnosis not present

## 2023-02-25 DIAGNOSIS — M48061 Spinal stenosis, lumbar region without neurogenic claudication: Secondary | ICD-10-CM | POA: Diagnosis not present

## 2023-02-25 DIAGNOSIS — M25519 Pain in unspecified shoulder: Secondary | ICD-10-CM | POA: Diagnosis not present

## 2023-02-26 DIAGNOSIS — G309 Alzheimer's disease, unspecified: Secondary | ICD-10-CM | POA: Diagnosis not present

## 2023-02-26 DIAGNOSIS — F02A0 Dementia in other diseases classified elsewhere, mild, without behavioral disturbance, psychotic disturbance, mood disturbance, and anxiety: Secondary | ICD-10-CM | POA: Diagnosis not present

## 2023-02-26 DIAGNOSIS — K080 Exfoliation of teeth due to systemic causes: Secondary | ICD-10-CM | POA: Diagnosis not present

## 2023-02-28 DIAGNOSIS — M792 Neuralgia and neuritis, unspecified: Secondary | ICD-10-CM | POA: Diagnosis not present

## 2023-03-04 DIAGNOSIS — M25811 Other specified joint disorders, right shoulder: Secondary | ICD-10-CM | POA: Diagnosis not present

## 2023-03-04 DIAGNOSIS — M25812 Other specified joint disorders, left shoulder: Secondary | ICD-10-CM | POA: Diagnosis not present

## 2023-03-11 DIAGNOSIS — H6123 Impacted cerumen, bilateral: Secondary | ICD-10-CM | POA: Diagnosis not present

## 2023-03-19 DIAGNOSIS — G3184 Mild cognitive impairment, so stated: Secondary | ICD-10-CM | POA: Diagnosis not present

## 2023-03-19 DIAGNOSIS — R413 Other amnesia: Secondary | ICD-10-CM | POA: Diagnosis not present

## 2023-03-19 DIAGNOSIS — G309 Alzheimer's disease, unspecified: Secondary | ICD-10-CM | POA: Diagnosis not present

## 2023-03-19 DIAGNOSIS — F02A0 Dementia in other diseases classified elsewhere, mild, without behavioral disturbance, psychotic disturbance, mood disturbance, and anxiety: Secondary | ICD-10-CM | POA: Diagnosis not present

## 2023-03-21 DIAGNOSIS — G7000 Myasthenia gravis without (acute) exacerbation: Secondary | ICD-10-CM | POA: Diagnosis not present

## 2023-03-21 DIAGNOSIS — D472 Monoclonal gammopathy: Secondary | ICD-10-CM | POA: Diagnosis not present

## 2023-03-21 DIAGNOSIS — G4709 Other insomnia: Secondary | ICD-10-CM | POA: Diagnosis not present

## 2023-03-21 DIAGNOSIS — Z79899 Other long term (current) drug therapy: Secondary | ICD-10-CM | POA: Diagnosis not present

## 2023-03-21 DIAGNOSIS — G250 Essential tremor: Secondary | ICD-10-CM | POA: Diagnosis not present

## 2023-03-25 DIAGNOSIS — M25512 Pain in left shoulder: Secondary | ICD-10-CM | POA: Diagnosis not present

## 2023-03-25 DIAGNOSIS — M25511 Pain in right shoulder: Secondary | ICD-10-CM | POA: Diagnosis not present

## 2023-03-31 DIAGNOSIS — R413 Other amnesia: Secondary | ICD-10-CM | POA: Diagnosis not present

## 2023-03-31 DIAGNOSIS — G309 Alzheimer's disease, unspecified: Secondary | ICD-10-CM | POA: Diagnosis not present

## 2023-04-15 DIAGNOSIS — G3184 Mild cognitive impairment, so stated: Secondary | ICD-10-CM | POA: Diagnosis not present

## 2023-06-04 DIAGNOSIS — R600 Localized edema: Secondary | ICD-10-CM | POA: Diagnosis not present

## 2023-06-04 DIAGNOSIS — D472 Monoclonal gammopathy: Secondary | ICD-10-CM | POA: Diagnosis not present

## 2023-06-04 DIAGNOSIS — K2270 Barrett's esophagus without dysplasia: Secondary | ICD-10-CM | POA: Diagnosis not present

## 2023-06-04 DIAGNOSIS — Z8719 Personal history of other diseases of the digestive system: Secondary | ICD-10-CM | POA: Diagnosis not present

## 2023-06-04 DIAGNOSIS — M75112 Incomplete rotator cuff tear or rupture of left shoulder, not specified as traumatic: Secondary | ICD-10-CM | POA: Diagnosis not present

## 2023-06-04 DIAGNOSIS — F341 Dysthymic disorder: Secondary | ICD-10-CM | POA: Diagnosis not present

## 2023-06-04 DIAGNOSIS — H6123 Impacted cerumen, bilateral: Secondary | ICD-10-CM | POA: Diagnosis not present

## 2023-06-04 DIAGNOSIS — R413 Other amnesia: Secondary | ICD-10-CM | POA: Diagnosis not present

## 2023-06-04 DIAGNOSIS — G7000 Myasthenia gravis without (acute) exacerbation: Secondary | ICD-10-CM | POA: Diagnosis not present

## 2023-06-04 DIAGNOSIS — M150 Primary generalized (osteo)arthritis: Secondary | ICD-10-CM | POA: Diagnosis not present

## 2023-06-04 DIAGNOSIS — G250 Essential tremor: Secondary | ICD-10-CM | POA: Diagnosis not present

## 2023-06-04 DIAGNOSIS — F5101 Primary insomnia: Secondary | ICD-10-CM | POA: Diagnosis not present

## 2023-06-04 DIAGNOSIS — F419 Anxiety disorder, unspecified: Secondary | ICD-10-CM | POA: Diagnosis not present

## 2023-07-02 DIAGNOSIS — D472 Monoclonal gammopathy: Secondary | ICD-10-CM | POA: Diagnosis not present

## 2023-07-11 ENCOUNTER — Other Ambulatory Visit: Payer: Self-pay

## 2023-07-11 DIAGNOSIS — D472 Monoclonal gammopathy: Secondary | ICD-10-CM

## 2023-07-12 ENCOUNTER — Inpatient Hospital Stay: Payer: Medicare Other

## 2023-07-19 ENCOUNTER — Inpatient Hospital Stay: Payer: Medicare Other | Admitting: Hematology

## 2023-08-12 DIAGNOSIS — N1831 Chronic kidney disease, stage 3a: Secondary | ICD-10-CM | POA: Diagnosis not present

## 2023-08-21 DIAGNOSIS — N1831 Chronic kidney disease, stage 3a: Secondary | ICD-10-CM | POA: Diagnosis not present

## 2023-08-21 DIAGNOSIS — N2581 Secondary hyperparathyroidism of renal origin: Secondary | ICD-10-CM | POA: Diagnosis not present

## 2023-08-21 DIAGNOSIS — D631 Anemia in chronic kidney disease: Secondary | ICD-10-CM | POA: Diagnosis not present

## 2023-08-21 DIAGNOSIS — D472 Monoclonal gammopathy: Secondary | ICD-10-CM | POA: Diagnosis not present

## 2023-09-10 DIAGNOSIS — K080 Exfoliation of teeth due to systemic causes: Secondary | ICD-10-CM | POA: Diagnosis not present

## 2023-09-12 DIAGNOSIS — C61 Malignant neoplasm of prostate: Secondary | ICD-10-CM | POA: Diagnosis not present

## 2023-09-12 DIAGNOSIS — R972 Elevated prostate specific antigen [PSA]: Secondary | ICD-10-CM | POA: Diagnosis not present

## 2023-09-26 DIAGNOSIS — G7000 Myasthenia gravis without (acute) exacerbation: Secondary | ICD-10-CM | POA: Diagnosis not present

## 2023-09-26 DIAGNOSIS — Z7952 Long term (current) use of systemic steroids: Secondary | ICD-10-CM | POA: Diagnosis not present

## 2023-10-03 DIAGNOSIS — M6281 Muscle weakness (generalized): Secondary | ICD-10-CM | POA: Diagnosis not present

## 2023-10-03 DIAGNOSIS — M25812 Other specified joint disorders, left shoulder: Secondary | ICD-10-CM | POA: Diagnosis not present

## 2023-10-03 DIAGNOSIS — M25811 Other specified joint disorders, right shoulder: Secondary | ICD-10-CM | POA: Diagnosis not present

## 2023-10-08 DIAGNOSIS — M25811 Other specified joint disorders, right shoulder: Secondary | ICD-10-CM | POA: Diagnosis not present

## 2023-10-08 DIAGNOSIS — M25812 Other specified joint disorders, left shoulder: Secondary | ICD-10-CM | POA: Diagnosis not present

## 2023-10-08 DIAGNOSIS — M6281 Muscle weakness (generalized): Secondary | ICD-10-CM | POA: Diagnosis not present

## 2023-10-11 DIAGNOSIS — M25812 Other specified joint disorders, left shoulder: Secondary | ICD-10-CM | POA: Diagnosis not present

## 2023-10-11 DIAGNOSIS — M6281 Muscle weakness (generalized): Secondary | ICD-10-CM | POA: Diagnosis not present

## 2023-10-11 DIAGNOSIS — M25811 Other specified joint disorders, right shoulder: Secondary | ICD-10-CM | POA: Diagnosis not present

## 2023-10-15 DIAGNOSIS — M25811 Other specified joint disorders, right shoulder: Secondary | ICD-10-CM | POA: Diagnosis not present

## 2023-10-15 DIAGNOSIS — M6281 Muscle weakness (generalized): Secondary | ICD-10-CM | POA: Diagnosis not present

## 2023-10-15 DIAGNOSIS — M25812 Other specified joint disorders, left shoulder: Secondary | ICD-10-CM | POA: Diagnosis not present

## 2023-10-17 DIAGNOSIS — M25811 Other specified joint disorders, right shoulder: Secondary | ICD-10-CM | POA: Diagnosis not present

## 2023-10-17 DIAGNOSIS — M25812 Other specified joint disorders, left shoulder: Secondary | ICD-10-CM | POA: Diagnosis not present

## 2023-10-17 DIAGNOSIS — M6281 Muscle weakness (generalized): Secondary | ICD-10-CM | POA: Diagnosis not present

## 2023-10-22 DIAGNOSIS — M6281 Muscle weakness (generalized): Secondary | ICD-10-CM | POA: Diagnosis not present

## 2023-10-22 DIAGNOSIS — M25811 Other specified joint disorders, right shoulder: Secondary | ICD-10-CM | POA: Diagnosis not present

## 2023-10-22 DIAGNOSIS — M25812 Other specified joint disorders, left shoulder: Secondary | ICD-10-CM | POA: Diagnosis not present

## 2023-10-24 DIAGNOSIS — M25811 Other specified joint disorders, right shoulder: Secondary | ICD-10-CM | POA: Diagnosis not present

## 2023-10-24 DIAGNOSIS — M25812 Other specified joint disorders, left shoulder: Secondary | ICD-10-CM | POA: Diagnosis not present

## 2023-10-24 DIAGNOSIS — R531 Weakness: Secondary | ICD-10-CM | POA: Diagnosis not present

## 2023-10-29 DIAGNOSIS — M6281 Muscle weakness (generalized): Secondary | ICD-10-CM | POA: Diagnosis not present

## 2023-10-29 DIAGNOSIS — M25812 Other specified joint disorders, left shoulder: Secondary | ICD-10-CM | POA: Diagnosis not present

## 2023-10-29 DIAGNOSIS — M25811 Other specified joint disorders, right shoulder: Secondary | ICD-10-CM | POA: Diagnosis not present

## 2023-10-31 DIAGNOSIS — M6281 Muscle weakness (generalized): Secondary | ICD-10-CM | POA: Diagnosis not present

## 2023-10-31 DIAGNOSIS — M25812 Other specified joint disorders, left shoulder: Secondary | ICD-10-CM | POA: Diagnosis not present

## 2023-10-31 DIAGNOSIS — M25811 Other specified joint disorders, right shoulder: Secondary | ICD-10-CM | POA: Diagnosis not present

## 2023-11-06 DIAGNOSIS — M25811 Other specified joint disorders, right shoulder: Secondary | ICD-10-CM | POA: Diagnosis not present

## 2023-11-06 DIAGNOSIS — M25812 Other specified joint disorders, left shoulder: Secondary | ICD-10-CM | POA: Diagnosis not present

## 2023-11-06 DIAGNOSIS — M6281 Muscle weakness (generalized): Secondary | ICD-10-CM | POA: Diagnosis not present

## 2023-11-11 DIAGNOSIS — M25811 Other specified joint disorders, right shoulder: Secondary | ICD-10-CM | POA: Diagnosis not present

## 2023-11-11 DIAGNOSIS — M6281 Muscle weakness (generalized): Secondary | ICD-10-CM | POA: Diagnosis not present

## 2023-11-11 DIAGNOSIS — M25812 Other specified joint disorders, left shoulder: Secondary | ICD-10-CM | POA: Diagnosis not present

## 2023-11-13 DIAGNOSIS — M6281 Muscle weakness (generalized): Secondary | ICD-10-CM | POA: Diagnosis not present

## 2023-11-13 DIAGNOSIS — M25811 Other specified joint disorders, right shoulder: Secondary | ICD-10-CM | POA: Diagnosis not present

## 2023-11-13 DIAGNOSIS — M25812 Other specified joint disorders, left shoulder: Secondary | ICD-10-CM | POA: Diagnosis not present

## 2023-11-18 DIAGNOSIS — M6281 Muscle weakness (generalized): Secondary | ICD-10-CM | POA: Diagnosis not present

## 2023-11-18 DIAGNOSIS — M25811 Other specified joint disorders, right shoulder: Secondary | ICD-10-CM | POA: Diagnosis not present

## 2023-11-18 DIAGNOSIS — M25812 Other specified joint disorders, left shoulder: Secondary | ICD-10-CM | POA: Diagnosis not present

## 2023-11-19 DIAGNOSIS — H6123 Impacted cerumen, bilateral: Secondary | ICD-10-CM | POA: Diagnosis not present

## 2023-11-21 DIAGNOSIS — M25811 Other specified joint disorders, right shoulder: Secondary | ICD-10-CM | POA: Diagnosis not present

## 2023-11-21 DIAGNOSIS — M6281 Muscle weakness (generalized): Secondary | ICD-10-CM | POA: Diagnosis not present

## 2023-11-21 DIAGNOSIS — M25812 Other specified joint disorders, left shoulder: Secondary | ICD-10-CM | POA: Diagnosis not present

## 2023-11-25 DIAGNOSIS — M25811 Other specified joint disorders, right shoulder: Secondary | ICD-10-CM | POA: Diagnosis not present

## 2023-11-25 DIAGNOSIS — M25812 Other specified joint disorders, left shoulder: Secondary | ICD-10-CM | POA: Diagnosis not present

## 2023-11-25 DIAGNOSIS — M6281 Muscle weakness (generalized): Secondary | ICD-10-CM | POA: Diagnosis not present

## 2023-11-27 DIAGNOSIS — H5213 Myopia, bilateral: Secondary | ICD-10-CM | POA: Diagnosis not present

## 2023-11-27 DIAGNOSIS — Z136 Encounter for screening for cardiovascular disorders: Secondary | ICD-10-CM | POA: Diagnosis not present

## 2023-11-28 DIAGNOSIS — M25811 Other specified joint disorders, right shoulder: Secondary | ICD-10-CM | POA: Diagnosis not present

## 2023-11-28 DIAGNOSIS — M25812 Other specified joint disorders, left shoulder: Secondary | ICD-10-CM | POA: Diagnosis not present

## 2023-11-28 DIAGNOSIS — M6281 Muscle weakness (generalized): Secondary | ICD-10-CM | POA: Diagnosis not present

## 2023-12-02 DIAGNOSIS — M75121 Complete rotator cuff tear or rupture of right shoulder, not specified as traumatic: Secondary | ICD-10-CM | POA: Diagnosis not present

## 2023-12-02 DIAGNOSIS — M67912 Unspecified disorder of synovium and tendon, left shoulder: Secondary | ICD-10-CM | POA: Diagnosis not present

## 2023-12-03 DIAGNOSIS — F419 Anxiety disorder, unspecified: Secondary | ICD-10-CM | POA: Diagnosis not present

## 2023-12-03 DIAGNOSIS — M75121 Complete rotator cuff tear or rupture of right shoulder, not specified as traumatic: Secondary | ICD-10-CM | POA: Diagnosis not present

## 2023-12-03 DIAGNOSIS — Z0000 Encounter for general adult medical examination without abnormal findings: Secondary | ICD-10-CM | POA: Diagnosis not present

## 2023-12-03 DIAGNOSIS — Z8546 Personal history of malignant neoplasm of prostate: Secondary | ICD-10-CM | POA: Diagnosis not present

## 2023-12-03 DIAGNOSIS — D472 Monoclonal gammopathy: Secondary | ICD-10-CM | POA: Diagnosis not present

## 2023-12-03 DIAGNOSIS — M75122 Complete rotator cuff tear or rupture of left shoulder, not specified as traumatic: Secondary | ICD-10-CM | POA: Diagnosis not present

## 2023-12-03 DIAGNOSIS — Z8719 Personal history of other diseases of the digestive system: Secondary | ICD-10-CM | POA: Diagnosis not present

## 2023-12-09 DIAGNOSIS — H903 Sensorineural hearing loss, bilateral: Secondary | ICD-10-CM | POA: Diagnosis not present

## 2023-12-12 DIAGNOSIS — K2270 Barrett's esophagus without dysplasia: Secondary | ICD-10-CM | POA: Diagnosis not present

## 2023-12-12 DIAGNOSIS — R197 Diarrhea, unspecified: Secondary | ICD-10-CM | POA: Diagnosis not present

## 2024-02-18 DIAGNOSIS — G250 Essential tremor: Secondary | ICD-10-CM | POA: Diagnosis not present

## 2024-02-18 DIAGNOSIS — F341 Dysthymic disorder: Secondary | ICD-10-CM | POA: Diagnosis not present

## 2024-02-18 DIAGNOSIS — F5101 Primary insomnia: Secondary | ICD-10-CM | POA: Diagnosis not present

## 2024-02-18 DIAGNOSIS — F419 Anxiety disorder, unspecified: Secondary | ICD-10-CM | POA: Diagnosis not present

## 2024-02-18 DIAGNOSIS — Z8546 Personal history of malignant neoplasm of prostate: Secondary | ICD-10-CM | POA: Diagnosis not present

## 2024-02-18 DIAGNOSIS — H903 Sensorineural hearing loss, bilateral: Secondary | ICD-10-CM | POA: Diagnosis not present

## 2024-02-18 DIAGNOSIS — D472 Monoclonal gammopathy: Secondary | ICD-10-CM | POA: Diagnosis not present

## 2024-02-18 DIAGNOSIS — R600 Localized edema: Secondary | ICD-10-CM | POA: Diagnosis not present

## 2024-02-18 DIAGNOSIS — M150 Primary generalized (osteo)arthritis: Secondary | ICD-10-CM | POA: Diagnosis not present

## 2024-02-18 DIAGNOSIS — M19042 Primary osteoarthritis, left hand: Secondary | ICD-10-CM | POA: Diagnosis not present

## 2024-02-18 DIAGNOSIS — G7000 Myasthenia gravis without (acute) exacerbation: Secondary | ICD-10-CM | POA: Diagnosis not present

## 2024-02-18 DIAGNOSIS — M75112 Incomplete rotator cuff tear or rupture of left shoulder, not specified as traumatic: Secondary | ICD-10-CM | POA: Diagnosis not present

## 2024-02-19 DIAGNOSIS — C61 Malignant neoplasm of prostate: Secondary | ICD-10-CM | POA: Diagnosis not present

## 2024-02-19 DIAGNOSIS — D472 Monoclonal gammopathy: Secondary | ICD-10-CM | POA: Diagnosis not present

## 2024-02-25 DIAGNOSIS — R2689 Other abnormalities of gait and mobility: Secondary | ICD-10-CM | POA: Diagnosis not present

## 2024-02-25 DIAGNOSIS — M6281 Muscle weakness (generalized): Secondary | ICD-10-CM | POA: Diagnosis not present

## 2024-03-03 DIAGNOSIS — R2689 Other abnormalities of gait and mobility: Secondary | ICD-10-CM | POA: Diagnosis not present

## 2024-03-03 DIAGNOSIS — M6281 Muscle weakness (generalized): Secondary | ICD-10-CM | POA: Diagnosis not present

## 2024-03-06 DIAGNOSIS — R2681 Unsteadiness on feet: Secondary | ICD-10-CM | POA: Diagnosis not present

## 2024-03-06 DIAGNOSIS — M6281 Muscle weakness (generalized): Secondary | ICD-10-CM | POA: Diagnosis not present

## 2024-03-10 DIAGNOSIS — R2689 Other abnormalities of gait and mobility: Secondary | ICD-10-CM | POA: Diagnosis not present

## 2024-03-10 DIAGNOSIS — M6281 Muscle weakness (generalized): Secondary | ICD-10-CM | POA: Diagnosis not present

## 2024-03-12 DIAGNOSIS — K080 Exfoliation of teeth due to systemic causes: Secondary | ICD-10-CM | POA: Diagnosis not present

## 2024-03-12 DIAGNOSIS — R2689 Other abnormalities of gait and mobility: Secondary | ICD-10-CM | POA: Diagnosis not present

## 2024-03-12 DIAGNOSIS — M6281 Muscle weakness (generalized): Secondary | ICD-10-CM | POA: Diagnosis not present

## 2024-03-13 DIAGNOSIS — B3742 Candidal balanitis: Secondary | ICD-10-CM | POA: Diagnosis not present

## 2024-03-17 DIAGNOSIS — M6281 Muscle weakness (generalized): Secondary | ICD-10-CM | POA: Diagnosis not present

## 2024-03-17 DIAGNOSIS — R2689 Other abnormalities of gait and mobility: Secondary | ICD-10-CM | POA: Diagnosis not present

## 2024-03-18 DIAGNOSIS — G3184 Mild cognitive impairment, so stated: Secondary | ICD-10-CM | POA: Diagnosis not present

## 2024-03-19 DIAGNOSIS — R2689 Other abnormalities of gait and mobility: Secondary | ICD-10-CM | POA: Diagnosis not present

## 2024-03-19 DIAGNOSIS — M6281 Muscle weakness (generalized): Secondary | ICD-10-CM | POA: Diagnosis not present

## 2024-03-24 DIAGNOSIS — M6281 Muscle weakness (generalized): Secondary | ICD-10-CM | POA: Diagnosis not present

## 2024-03-24 DIAGNOSIS — R2689 Other abnormalities of gait and mobility: Secondary | ICD-10-CM | POA: Diagnosis not present
